# Patient Record
Sex: Female | Born: 1935 | Race: White | Hispanic: No | Smoking: Never smoker
Health system: Southern US, Community
[De-identification: ages and names within clinical notes are randomized; demographics above are authoritative.]

## PROBLEM LIST (undated history)

## (undated) DIAGNOSIS — E7800 Pure hypercholesterolemia, unspecified: Secondary | ICD-10-CM

## (undated) DIAGNOSIS — E119 Type 2 diabetes mellitus without complications: Secondary | ICD-10-CM

## (undated) DIAGNOSIS — F0390 Unspecified dementia without behavioral disturbance: Secondary | ICD-10-CM

## (undated) DIAGNOSIS — I509 Heart failure, unspecified: Secondary | ICD-10-CM

## (undated) DIAGNOSIS — I10 Essential (primary) hypertension: Secondary | ICD-10-CM

## (undated) HISTORY — DX: Heart failure, unspecified: I50.9

## (undated) HISTORY — PX: ABDOMINAL HYSTERECTOMY: SHX81

## (undated) HISTORY — PX: HAND SURGERY: SHX662

---

## 1998-11-03 ENCOUNTER — Encounter: Payer: Self-pay | Admitting: Emergency Medicine

## 1998-11-03 ENCOUNTER — Emergency Department (HOSPITAL_COMMUNITY): Admission: EM | Admit: 1998-11-03 | Discharge: 1998-11-03 | Payer: Self-pay | Admitting: Emergency Medicine

## 1998-12-09 ENCOUNTER — Ambulatory Visit (HOSPITAL_COMMUNITY): Admission: RE | Admit: 1998-12-09 | Discharge: 1998-12-09 | Payer: Self-pay | Admitting: Family Medicine

## 1998-12-09 ENCOUNTER — Encounter: Payer: Self-pay | Admitting: Family Medicine

## 1999-02-27 ENCOUNTER — Encounter: Payer: Self-pay | Admitting: Emergency Medicine

## 1999-02-27 ENCOUNTER — Emergency Department (HOSPITAL_COMMUNITY): Admission: EM | Admit: 1999-02-27 | Discharge: 1999-02-27 | Payer: Self-pay | Admitting: Emergency Medicine

## 1999-09-23 ENCOUNTER — Encounter: Payer: Self-pay | Admitting: Family Medicine

## 1999-09-23 ENCOUNTER — Ambulatory Visit (HOSPITAL_COMMUNITY): Admission: RE | Admit: 1999-09-23 | Discharge: 1999-09-23 | Payer: Self-pay | Admitting: Family Medicine

## 2000-06-25 ENCOUNTER — Encounter: Payer: Self-pay | Admitting: Emergency Medicine

## 2000-06-25 ENCOUNTER — Emergency Department (HOSPITAL_COMMUNITY): Admission: EM | Admit: 2000-06-25 | Discharge: 2000-06-25 | Payer: Self-pay | Admitting: Emergency Medicine

## 2001-02-07 ENCOUNTER — Emergency Department (HOSPITAL_COMMUNITY): Admission: EM | Admit: 2001-02-07 | Discharge: 2001-02-07 | Payer: Self-pay | Admitting: Emergency Medicine

## 2001-02-07 ENCOUNTER — Encounter: Payer: Self-pay | Admitting: Emergency Medicine

## 2001-05-24 ENCOUNTER — Emergency Department (HOSPITAL_COMMUNITY): Admission: EM | Admit: 2001-05-24 | Discharge: 2001-05-24 | Payer: Self-pay | Admitting: Emergency Medicine

## 2001-05-24 ENCOUNTER — Encounter: Payer: Self-pay | Admitting: Emergency Medicine

## 2001-06-30 ENCOUNTER — Emergency Department (HOSPITAL_COMMUNITY): Admission: EM | Admit: 2001-06-30 | Discharge: 2001-06-30 | Payer: Self-pay | Admitting: Emergency Medicine

## 2002-02-10 ENCOUNTER — Encounter: Payer: Self-pay | Admitting: Emergency Medicine

## 2002-02-10 ENCOUNTER — Emergency Department (HOSPITAL_COMMUNITY): Admission: EM | Admit: 2002-02-10 | Discharge: 2002-02-10 | Payer: Self-pay | Admitting: Emergency Medicine

## 2002-04-03 ENCOUNTER — Encounter: Payer: Self-pay | Admitting: Otolaryngology

## 2002-04-03 ENCOUNTER — Encounter: Admission: RE | Admit: 2002-04-03 | Discharge: 2002-04-03 | Payer: Self-pay | Admitting: Otolaryngology

## 2002-04-05 ENCOUNTER — Encounter (INDEPENDENT_AMBULATORY_CARE_PROVIDER_SITE_OTHER): Payer: Self-pay | Admitting: *Deleted

## 2002-04-05 ENCOUNTER — Ambulatory Visit (HOSPITAL_BASED_OUTPATIENT_CLINIC_OR_DEPARTMENT_OTHER): Admission: RE | Admit: 2002-04-05 | Discharge: 2002-04-05 | Payer: Self-pay | Admitting: Otolaryngology

## 2002-09-29 ENCOUNTER — Emergency Department (HOSPITAL_COMMUNITY): Admission: EM | Admit: 2002-09-29 | Discharge: 2002-09-29 | Payer: Self-pay | Admitting: Emergency Medicine

## 2002-09-29 ENCOUNTER — Encounter: Payer: Self-pay | Admitting: Emergency Medicine

## 2003-09-10 ENCOUNTER — Encounter: Admission: RE | Admit: 2003-09-10 | Discharge: 2003-09-10 | Payer: Self-pay | Admitting: Family Medicine

## 2004-07-02 ENCOUNTER — Emergency Department (HOSPITAL_COMMUNITY): Admission: EM | Admit: 2004-07-02 | Discharge: 2004-07-02 | Payer: Self-pay | Admitting: Family Medicine

## 2004-09-11 ENCOUNTER — Emergency Department (HOSPITAL_COMMUNITY): Admission: EM | Admit: 2004-09-11 | Discharge: 2004-09-11 | Payer: Self-pay | Admitting: Emergency Medicine

## 2004-09-15 ENCOUNTER — Emergency Department (HOSPITAL_COMMUNITY): Admission: EM | Admit: 2004-09-15 | Discharge: 2004-09-15 | Payer: Self-pay | Admitting: Family Medicine

## 2004-11-30 ENCOUNTER — Emergency Department (HOSPITAL_COMMUNITY): Admission: EM | Admit: 2004-11-30 | Discharge: 2004-11-30 | Payer: Self-pay | Admitting: Emergency Medicine

## 2005-01-27 ENCOUNTER — Encounter: Admission: RE | Admit: 2005-01-27 | Discharge: 2005-01-27 | Payer: Self-pay | Admitting: Family Medicine

## 2005-02-14 ENCOUNTER — Encounter: Admission: RE | Admit: 2005-02-14 | Discharge: 2005-02-14 | Payer: Self-pay | Admitting: Family Medicine

## 2005-02-20 ENCOUNTER — Emergency Department (HOSPITAL_COMMUNITY): Admission: EM | Admit: 2005-02-20 | Discharge: 2005-02-21 | Payer: Self-pay | Admitting: Emergency Medicine

## 2005-04-27 ENCOUNTER — Encounter: Admission: RE | Admit: 2005-04-27 | Discharge: 2005-04-27 | Payer: Self-pay | Admitting: Family Medicine

## 2005-06-17 ENCOUNTER — Encounter (INDEPENDENT_AMBULATORY_CARE_PROVIDER_SITE_OTHER): Payer: Self-pay | Admitting: Cardiology

## 2005-06-17 ENCOUNTER — Ambulatory Visit (HOSPITAL_COMMUNITY): Admission: RE | Admit: 2005-06-17 | Discharge: 2005-06-18 | Payer: Self-pay | Admitting: Cardiology

## 2005-07-02 ENCOUNTER — Emergency Department (HOSPITAL_COMMUNITY): Admission: EM | Admit: 2005-07-02 | Discharge: 2005-07-02 | Payer: Self-pay | Admitting: Emergency Medicine

## 2005-08-01 ENCOUNTER — Emergency Department (HOSPITAL_COMMUNITY): Admission: EM | Admit: 2005-08-01 | Discharge: 2005-08-01 | Payer: Self-pay | Admitting: *Deleted

## 2005-08-29 ENCOUNTER — Emergency Department (HOSPITAL_COMMUNITY): Admission: EM | Admit: 2005-08-29 | Discharge: 2005-08-29 | Payer: Self-pay | Admitting: Emergency Medicine

## 2005-12-14 ENCOUNTER — Emergency Department (HOSPITAL_COMMUNITY): Admission: EM | Admit: 2005-12-14 | Discharge: 2005-12-14 | Payer: Self-pay | Admitting: Emergency Medicine

## 2006-03-30 ENCOUNTER — Encounter: Admission: RE | Admit: 2006-03-30 | Discharge: 2006-03-30 | Payer: Self-pay | Admitting: Pediatrics

## 2007-01-02 ENCOUNTER — Encounter: Admission: RE | Admit: 2007-01-02 | Discharge: 2007-01-02 | Payer: Self-pay | Admitting: Orthopedic Surgery

## 2007-01-03 ENCOUNTER — Ambulatory Visit (HOSPITAL_BASED_OUTPATIENT_CLINIC_OR_DEPARTMENT_OTHER): Admission: RE | Admit: 2007-01-03 | Discharge: 2007-01-04 | Payer: Self-pay | Admitting: Orthopedic Surgery

## 2008-11-05 ENCOUNTER — Encounter: Admission: RE | Admit: 2008-11-05 | Discharge: 2008-11-05 | Payer: Self-pay | Admitting: Geriatric Medicine

## 2010-06-13 ENCOUNTER — Encounter: Payer: Self-pay | Admitting: Family Medicine

## 2010-10-05 NOTE — Op Note (Signed)
NAMECLORINDA, WYBLE                  ACCOUNT NO.:  [id number]   MEDICAL RECORD NO.:  [id number]          PATIENT TYPE:  AMB   LOCATION:  DSC                          FACILITY:  [facility]   PHYSICIAN:  Matthew A. Weingold, M.D.DATE OF BIRTH:  1929/12/31   DATE OF PROCEDURE:  01/03/2007  DATE OF DISCHARGE:                               OPERATIVE REPORT   PREOPERATIVE DIAGNOSIS:  Left thumb carpometacarpal arthritis, left  thumb metacarpophalangeal arthritis.   POSTOPERATIVE DIAGNOSIS:  Left thumb carpometacarpal arthritis, left  thumb metacarpophalangeal arthritis.   PROCEDURE:  1. Left thumb carpometacarpal joint suspension plasty with abductor      pollicis longus tendon transfer.  2. Left thumb MP fusion with 28 mm mini Acutrak screw, local bone      graft.   SURGEON:  Artist Pais. Mina Marble, M.D.   ASSISTANT:  None.   ANESTHESIA:  General.   TOURNIQUET TIME:  90 minutes.   COMPLICATIONS:  None.   DRAINS:  None.   OPERATIVE REPORT:  The patient was taken to operating suite and after  induction of adequate general anesthesia, left upper extremity was  prepped in sterile fashion.  Esmarch was used to exsanguinate the limb.  Tourniquet inflated to 250 mm.  At this point in time incision was made  in the [facility] joint longitudinally.  Skin was incised.  Snuffbox artery was  identified and retracted.  The [facility] joint was entered dorsally and  elevated off the base of the thumb metacarpal and trapezium.  The  trapezium was removed in piecemeal using curettes, osteotomes and  rongeurs.  Once this was done, the [facility] synovectomy was performed next  transosseous canal was created thumb metacarpal base using a hand awl  and sequential hand drillings.  Once this was completed the APL tendon  was identified in the base of the original wound.  The radial most slip  was separated was then traced under the skin.  The first dorsal  compartment released under the skin bridge and a second incision was  made at the musculotendinous junction.  The APL musculotendinous  junction was incised and the radial slip was then drawn to the distal  wound.  It was then passed from dorsal to volar out the thumb metacarpal  wrapped around the flexor carpi radialis at the base of the wound,  sutured with 2-0 FiberWire and back up on top of the thumb metacarpal  base sutured to itself again with 2-0 FiberWire thus creating the  suspension.  After this was done, the wounds irrigated.  A third  incision was made over the MP joint of the thumb.  Dissection was  carried down through the extensor sheath which was incised and retracted  ulnarly.  The joint was incised longitudinally.  The capsule was  elevated off the joint.  Decortication was then taken out until  cancellus bone was achieved.  Thumb MP joint was then placed in 35  degrees of flexion and slight pronation followed by placement of guide  wire across the fusion site to the mini Acutrak screw.  A 28 mm Acutrak  screw was then placed in that area.  Local bone graft was harvested from  the surrounding area and packed into the fusion site.  Once this was  done, the wounds were all irrigated loosely closed in layers of 4-0  Vicryl for the  [facility] joint capsule, 4-0 Vicryl for the extensor mechanism over the MP  joint and 4-0 Vicryl Rapide for all three skin incisions.  Xeroform,  4x4s and radial gutter splint was applied.  The patient tolerated  procedures well and went recovery room stable fashion.      Artist Pais Mina Marble, M.D.  Electronically Signed     MAW/MEDQ  D:  01/03/2007  T:  01/04/2007  Job:  [id number]

## 2010-10-08 NOTE — Op Note (Signed)
   NAME:  Debbie Chambers, Debbie Chambers                            ACCOUNT NO.:  [id number]   MEDICAL RECORD NO.:  [id number]                   PATIENT TYPE:  AMB   LOCATION:  DSC                                  FACILITY:  [facility]   PHYSICIAN:  Christopher E. Ezzard Standing, M.D.         DATE OF BIRTH:  07/16/1929   DATE OF PROCEDURE:  04/05/2002  DATE OF DISCHARGE:                                 OPERATIVE REPORT   PREOPERATIVE DIAGNOSIS:  Left neck nodule.   POSTOPERATIVE DIAGNOSIS:  Left neck nodule, (findings consistent with  probable lipoma).   OPERATION:  Excision of left neck mass 1.5 cm size.   SURGEON:  Kristine Garbe. Ezzard Standing, M.D.   ANESTHESIA:  Via MAC.   COMPLICATIONS:  None.   INDICATIONS FOR PROCEDURE:  The patient is a 77 year old female who has had  an approximately 1.5 cm left neck nodule along the posterior aspect of the  left sternocleidomastoid muscle for over two months now.  She is taken to  the operating room at this time for an excision of the left neck nodule.   DESCRIPTION OF PROCEDURE:  After adequate IV sedation, the patient's left  neck was prepped and draped with Betadine solution.  The area of the nodule  was marked out and injected with 3 cc of Xylocaine with epinephrine.  An  incision was made directly over the nodule.  The dissection was carried down  through the subcutaneous tissue.  The nodule was consistent with probable  lipoma, as it was superficial to the sternocleidomastoid muscle and was  adjacent to the great auricular nerve.  The nodule was excised and sent to  pathology.  Hemostasis was obtained with cautery, and the wound was closed  with #3-0 chromic sutures subcutaneously and #5-0 nylon on the skin.  A  sterile dressing was applied.  The patient was subsequently transferred to the recovery room  postoperatively doing well.   DISPOSITION:  The patient is discharged home later this morning on Tylenol  and Darvocet for pain.  Followup in my office in one  week, to have the  sutures removed and to review the pathology.                                                 Kristine Garbe. Ezzard Standing, M.D.    CEN/MEDQ  D:  04/05/2002  T:  04/05/2002  Job:  [id number]   cc:   Loura Back, M.D.

## 2010-10-08 NOTE — H&P (Signed)
[name], Chambers NO.:  [id number]   MEDICAL RECORD NO.:  [id number]          PATIENT TYPE:  OIB   LOCATION:  2628                         FACILITY:  [facility]   PHYSICIAN:  Francisca December, M.D.  DATE OF BIRTH:  [birth year]   DATE OF ADMISSION:  06/17/2005  DATE OF DISCHARGE:                                HISTORY & PHYSICAL   DISCHARGE DIAGNOSES:  1.  Atrial fibrillation, resolved.  2.  Hyperlipidemia, treated.  3.  Diabetes mellitus, treated.  4.  Hypertension, treated.  5.  Right supraspinatus tear, for shoulder surgery soon.  6.  Proteinuria.   Debbie Chambers is a 77 year old female who was in the preoperative area today at  [facility] in order to undergo shoulder surgery by Dr. Doneen Poisson. She developed rapid SVT with a ventricular rate at 170. Her  surgery was cancelled and she was brought to the holding area for cardiology  evaluation. Her EKG showed normal sinus rhythm with occasional short bursts  of SVT/atrial fibrillation. She is currently in normal sinus rhythm with a  rate in the 90s.   She was seen in the office in October 2006 by Dr. Corliss Marcus for  preoperative clearance. At that time she underwent an adenosine Cardiolite.  Her ejection fraction was 75% with no ischemia and no wall motion  abnormalities.   ALLERGIES:  PENICILLIN causes a rash.   MEDICATIONS:  1.  Lipitor 40 mg a day.  2.  Glyburide 2.5/500 mg b.i.d.  3.  Norvasc 10 mg a day.  4.  Hydrochlorothiazide 12.5 mg daily.  5.  Enteric-coated aspirin 325 mg a day.   SOCIAL HISTORY:  She is a homemaker, she is widowed. She had an aide who  helps her at home. No tobacco, alcohol, or illicit drug use.   FAMILY HISTORY:  Father deceased at age 80 of myocardial infarction.   PAST MEDICAL HISTORY:  1.  Diabetes mellitus, oral agents.  2.  Hypertension.  3.  Hyperlipidemia.  4.  Proteinuria.  5.  She has undergone a hysterectomy, cholecystectomy, and cataract  surgery.   REVIEW OF SYSTEMS:  Otherwise as above.   PHYSICAL EXAMINATION:  VITAL SIGNS:  Pulse 101, respirations 16, blood  pressure is 140/70 at bedside, O2 saturation is 94% on room air.  HEENT:  Grossly normal. No carotid bruits, no subclavian bruits, no JVD, no  thyromegaly. Sclerae clear, conjunctivae normal. Nares without drainage.  CHEST:  Clear to auscultation bilaterally. No wheeze or rhonchi.  HEART:  Regular rate and rhythm with a 1/6 systolic murmur at the left  sternal border and no diastolic component.  ABDOMEN:  Soft, nontender, nondistended, no masses, no bruits.  EXTREMITIES:  No peripheral edema.  SKIN:  Warm and dry.  NEUROLOGIC:  Alert and oriented x3.   LABORATORY STUDIES:  Show sodium 140, potassium 4.2, BUN 18, creatinine 0.9.  White count 12.4, hemoglobin 12.9, hematocrit 38.4, platelets 350. EKG shows  normal sinus rhythm with a ventricular response at 109 beats per minute. She  does have frequent PACs and occasional  short bursts of atrial fibrillation.   ASSESSMENT AND PLAN:  1.  Paroxysmal atrial fibrillation.  2.  Hypertension.  3.  Hyperlipidemia.  4.  Diabetes mellitus.  5.  Proteinuria.  6.  Right supraspinatus tear; shoulder surgery on hold.   The patient has been seen and examined by Dr. Amil Amen. We will go ahead and  obtain an emergent 2-D echocardiogram. The surgery has been on hold for now.  Hopefully this can be resumed next week. We will go ahead and add Lopressor.  We will go ahead and give her IV Lopressor and then start oral Lopressor 25  mg p.o. b.i.d. We will watch her in the hospital overnight and if she  remains stable, plan for discharge tomorrow.      Debbie Chambers, P.A.      Francisca December, M.D.  Electronically Signed    LB/MEDQ  D:  06/17/2005  T:  06/18/2005  Job:  [id number]   cc:   Vanita Panda. Magnus Ivan, M.D.  Fax: [phone]

## 2011-03-07 LAB — BASIC METABOLIC PANEL
BUN: 14
CO2: 28
Calcium: 9.3
Chloride: 101
Creatinine, Ser: 0.85
GFR calc Af Amer: >60
GFR calc non Af Amer: >60
Glucose, Bld: 140 — ABNORMAL HIGH
Potassium: 4.3
Sodium: 138

## 2011-03-07 LAB — POCT HEMOGLOBIN-HEMACUE
Hemoglobin: 12.4
Operator id: 123881

## 2012-11-12 ENCOUNTER — Encounter (HOSPITAL_COMMUNITY): Payer: Self-pay | Admitting: Emergency Medicine

## 2012-11-12 DIAGNOSIS — Z79899 Other long term (current) drug therapy: Secondary | ICD-10-CM

## 2012-11-12 DIAGNOSIS — Z794 Long term (current) use of insulin: Secondary | ICD-10-CM

## 2012-11-12 DIAGNOSIS — E119 Type 2 diabetes mellitus without complications: Secondary | ICD-10-CM | POA: Diagnosis present

## 2012-11-12 DIAGNOSIS — E785 Hyperlipidemia, unspecified: Secondary | ICD-10-CM | POA: Diagnosis present

## 2012-11-12 DIAGNOSIS — N189 Chronic kidney disease, unspecified: Secondary | ICD-10-CM | POA: Diagnosis present

## 2012-11-12 DIAGNOSIS — Z7982 Long term (current) use of aspirin: Secondary | ICD-10-CM

## 2012-11-12 DIAGNOSIS — E7800 Pure hypercholesterolemia, unspecified: Secondary | ICD-10-CM | POA: Diagnosis present

## 2012-11-12 DIAGNOSIS — I129 Hypertensive chronic kidney disease with stage 1 through stage 4 chronic kidney disease, or unspecified chronic kidney disease: Secondary | ICD-10-CM | POA: Diagnosis present

## 2012-11-12 DIAGNOSIS — I509 Heart failure, unspecified: Secondary | ICD-10-CM | POA: Diagnosis present

## 2012-11-12 DIAGNOSIS — N184 Chronic kidney disease, stage 4 (severe): Secondary | ICD-10-CM | POA: Diagnosis present

## 2012-11-12 DIAGNOSIS — F0390 Unspecified dementia without behavioral disturbance: Secondary | ICD-10-CM | POA: Diagnosis present

## 2012-11-12 DIAGNOSIS — Z8249 Family history of ischemic heart disease and other diseases of the circulatory system: Secondary | ICD-10-CM

## 2012-11-12 DIAGNOSIS — Z880 Allergy status to penicillin: Secondary | ICD-10-CM

## 2012-11-12 DIAGNOSIS — I5031 Acute diastolic (congestive) heart failure: Principal | ICD-10-CM | POA: Diagnosis present

## 2012-11-12 DIAGNOSIS — D509 Iron deficiency anemia, unspecified: Secondary | ICD-10-CM | POA: Diagnosis present

## 2012-11-12 DIAGNOSIS — D649 Anemia, unspecified: Secondary | ICD-10-CM | POA: Diagnosis present

## 2012-11-12 DIAGNOSIS — D72829 Elevated white blood cell count, unspecified: Secondary | ICD-10-CM | POA: Diagnosis present

## 2012-11-12 LAB — CBC WITH DIFFERENTIAL/PLATELET
Basophils Absolute: 0 10*3/uL (ref 0.0–0.1)
Basophils Relative: 0 % (ref 0–1)
Eosinophils Relative: 1 % (ref 0–5)
HCT: 38.7 % (ref 36.0–46.0)
Hemoglobin: 12.7 g/dL (ref 12.0–15.0)
Lymphocytes Relative: 13 % (ref 12–46)
MCH: 25.9 pg — ABNORMAL LOW (ref 26.0–34.0)
MCHC: 32.8 g/dL (ref 30.0–36.0)
MCV: 79 fL (ref 78.0–100.0)
Monocytes Absolute: 1.1 10*3/uL — ABNORMAL HIGH (ref 0.1–1.0)
Neutrophils Relative %: 77 % (ref 43–77)
Platelets: 351 10*3/uL (ref 150–400)
RBC: 4.9 MIL/uL (ref 3.87–5.11)
RDW: 14.8 % (ref 11.5–15.5)
WBC: 12 10*3/uL — ABNORMAL HIGH (ref 4.0–10.5)

## 2012-11-12 LAB — COMPREHENSIVE METABOLIC PANEL
ALT: 13 U/L (ref 0–35)
AST: 30 U/L (ref 0–37)
Albumin: 3.4 g/dL — ABNORMAL LOW (ref 3.5–5.2)
Alkaline Phosphatase: 26 U/L — ABNORMAL LOW (ref 39–117)
BUN: 46 mg/dL — ABNORMAL HIGH (ref 6–23)
CO2: 29 mEq/L (ref 19–32)
Calcium: 9.1 mg/dL (ref 8.4–10.5)
Chloride: 99 mEq/L (ref 96–112)
Creatinine, Ser: 1.72 mg/dL — ABNORMAL HIGH (ref 0.50–1.10)
GFR calc non Af Amer: 27 mL/min — ABNORMAL LOW (ref >90)
Potassium: 3.9 mEq/L (ref 3.5–5.1)
Sodium: 140 mEq/L (ref 135–145)
Total Bilirubin: 0.2 mg/dL — ABNORMAL LOW (ref 0.3–1.2)
Total Protein: 7.5 g/dL (ref 6.0–8.3)

## 2012-11-12 NOTE — ED Notes (Signed)
FAMILY REPORTS PROGRESSING LOWER LEGS/ANKLES/FEET EDEMA/SWELLING ONSET THIS WEEKEND , DENIES INJURY.

## 2012-11-13 ENCOUNTER — Inpatient Hospital Stay (HOSPITAL_COMMUNITY)
Admit: 2012-11-13 | Discharge: 2012-11-16 | DRG: 293 | Disposition: A | Discharge status: Home / Self Care | Payer: Medicare Other | Attending: Internal Medicine | Admitting: Internal Medicine

## 2012-11-13 ENCOUNTER — Encounter (HOSPITAL_COMMUNITY): Payer: Self-pay

## 2012-11-13 ENCOUNTER — Emergency Department (HOSPITAL_COMMUNITY): Payer: Medicare Other

## 2012-11-13 DIAGNOSIS — E119 Type 2 diabetes mellitus without complications: Secondary | ICD-10-CM

## 2012-11-13 DIAGNOSIS — F0390 Unspecified dementia without behavioral disturbance: Secondary | ICD-10-CM | POA: Diagnosis present

## 2012-11-13 DIAGNOSIS — E785 Hyperlipidemia, unspecified: Secondary | ICD-10-CM | POA: Diagnosis present

## 2012-11-13 DIAGNOSIS — I10 Essential (primary) hypertension: Secondary | ICD-10-CM

## 2012-11-13 DIAGNOSIS — I509 Heart failure, unspecified: Secondary | ICD-10-CM

## 2012-11-13 DIAGNOSIS — E1129 Type 2 diabetes mellitus with other diabetic kidney complication: Secondary | ICD-10-CM | POA: Diagnosis present

## 2012-11-13 DIAGNOSIS — I5031 Acute diastolic (congestive) heart failure: Secondary | ICD-10-CM

## 2012-11-13 HISTORY — DX: Essential (primary) hypertension: I10

## 2012-11-13 HISTORY — DX: Pure hypercholesterolemia, unspecified: E78.00

## 2012-11-13 HISTORY — DX: Type 2 diabetes mellitus without complications: E11.9

## 2012-11-13 HISTORY — DX: Unspecified dementia, unspecified severity, without behavioral disturbance, psychotic disturbance, mood disturbance, and anxiety: F03.90

## 2012-11-13 LAB — GLUCOSE, CAPILLARY
Glucose-Capillary: 128 mg/dL — ABNORMAL HIGH (ref 70–99)
Glucose-Capillary: 210 mg/dL — ABNORMAL HIGH (ref 70–99)

## 2012-11-13 LAB — CBC WITH DIFFERENTIAL/PLATELET
Basophils Absolute: 0 10*3/uL (ref 0.0–0.1)
Basophils Relative: 0 % (ref 0–1)
Eosinophils Absolute: 0.1 10*3/uL (ref 0.0–0.7)
Eosinophils Relative: 1 % (ref 0–5)
HCT: 39 % (ref 36.0–46.0)
MCH: 25.1 pg — ABNORMAL LOW (ref 26.0–34.0)
MCHC: 32.1 g/dL (ref 30.0–36.0)
MCV: 78.3 fL (ref 78.0–100.0)
Monocytes Absolute: 1 10*3/uL (ref 0.1–1.0)
Neutro Abs: 6.6 10*3/uL (ref 1.7–7.7)
RDW: 14.7 % (ref 11.5–15.5)

## 2012-11-13 LAB — URINALYSIS, ROUTINE W REFLEX MICROSCOPIC
Nitrite: NEGATIVE
Protein, ur: NEGATIVE mg/dL
Specific Gravity, Urine: 1.013 (ref 1.005–1.030)
Urobilinogen, UA: 0.2 mg/dL (ref 0.0–1.0)

## 2012-11-13 LAB — BASIC METABOLIC PANEL
BUN: 43 mg/dL — ABNORMAL HIGH (ref 6–23)
CO2: 27 mEq/L (ref 19–32)
Calcium: 9.2 mg/dL (ref 8.4–10.5)
Creatinine, Ser: 1.73 mg/dL — ABNORMAL HIGH (ref 0.50–1.10)

## 2012-11-13 LAB — PRO B NATRIURETIC PEPTIDE: Pro B Natriuretic peptide (BNP): 1362 pg/mL — ABNORMAL HIGH (ref 0–450)

## 2012-11-13 LAB — TSH: TSH: 1.51 u[IU]/mL (ref 0.350–4.500)

## 2012-11-13 LAB — OSMOLALITY, URINE: Osmolality, Ur: 377 mOsm/kg — ABNORMAL LOW (ref 390–1090)

## 2012-11-13 LAB — TROPONIN I: Troponin I: <0.3 ng/mL (ref <0.30)

## 2012-11-13 LAB — SODIUM, URINE, RANDOM: Sodium, Ur: 95 mEq/L

## 2012-11-13 MED ORDER — FUROSEMIDE 10 MG/ML IJ SOLN
40.0000 mg | Freq: Every day | INTRAMUSCULAR | Status: DC
  Administered 2012-11-13 – 2012-11-15 (×3): 40 mg via INTRAVENOUS
  Filled 2012-11-13 (×3): qty 4

## 2012-11-13 MED ORDER — METOPROLOL TARTRATE 25 MG PO TABS
25.0000 mg | ORAL_TABLET | Freq: Every day | ORAL | Status: DC
  Administered 2012-11-13 – 2012-11-14 (×2): 25 mg via ORAL
  Filled 2012-11-13 (×2): qty 1

## 2012-11-13 MED ORDER — AMLODIPINE BESYLATE 10 MG PO TABS
10.0000 mg | ORAL_TABLET | Freq: Every day | ORAL | Status: DC
  Administered 2012-11-13 – 2012-11-16 (×4): 10 mg via ORAL
  Filled 2012-11-13 (×4): qty 1

## 2012-11-13 MED ORDER — ADULT MULTIVITAMIN W/MINERALS CH
1.0000 | ORAL_TABLET | Freq: Every day | ORAL | Status: DC
  Administered 2012-11-13 – 2012-11-16 (×4): 1 via ORAL
  Filled 2012-11-13 (×4): qty 1

## 2012-11-13 MED ORDER — FENOFIBRATE 160 MG PO TABS
160.0000 mg | ORAL_TABLET | Freq: Every day | ORAL | Status: DC
  Administered 2012-11-13 – 2012-11-16 (×4): 160 mg via ORAL
  Filled 2012-11-13 (×4): qty 1

## 2012-11-13 MED ORDER — ENOXAPARIN SODIUM 40 MG/0.4ML ~~LOC~~ SOLN: 40.0000 mg | SUBCUTANEOUS | Status: DC

## 2012-11-13 MED ORDER — CLONIDINE HCL 0.2 MG PO TABS
0.2000 mg | ORAL_TABLET | Freq: Two times a day (BID) | ORAL | Status: DC
  Administered 2012-11-13 – 2012-11-14 (×3): 0.2 mg via ORAL
  Filled 2012-11-13 (×4): qty 1

## 2012-11-13 MED ORDER — ONDANSETRON HCL 4 MG/2ML IJ SOLN: 4.0000 mg | Freq: Four times a day (QID) | INTRAMUSCULAR | Status: DC | PRN

## 2012-11-13 MED ORDER — HYDRALAZINE HCL 25 MG PO TABS
25.0000 mg | ORAL_TABLET | Freq: Three times a day (TID) | ORAL | Status: DC
  Administered 2012-11-13 – 2012-11-16 (×10): 25 mg via ORAL
  Filled 2012-11-13 (×13): qty 1

## 2012-11-13 MED ORDER — ACETAMINOPHEN 325 MG PO TABS: 650.0000 mg | ORAL_TABLET | Freq: Four times a day (QID) | ORAL | Status: DC | PRN

## 2012-11-13 MED ORDER — ASPIRIN EC 325 MG PO TBEC
325.0000 mg | DELAYED_RELEASE_TABLET | Freq: Every day | ORAL | Status: DC
  Administered 2012-11-13 – 2012-11-16 (×4): 325 mg via ORAL
  Filled 2012-11-13 (×4): qty 1

## 2012-11-13 MED ORDER — FUROSEMIDE 10 MG/ML IJ SOLN
20.0000 mg | Freq: Once | INTRAMUSCULAR | Status: DC
  Administered 2012-11-13: 20 mg via INTRAVENOUS
  Filled 2012-11-13: qty 2

## 2012-11-13 MED ORDER — ONDANSETRON HCL 4 MG PO TABS: 4.0000 mg | ORAL_TABLET | Freq: Four times a day (QID) | ORAL | Status: DC | PRN

## 2012-11-13 MED ORDER — DONEPEZIL HCL 10 MG PO TABS
10.0000 mg | ORAL_TABLET | Freq: Every day | ORAL | Status: DC
  Administered 2012-11-13 – 2012-11-15 (×3): 10 mg via ORAL
  Filled 2012-11-13 (×4): qty 1

## 2012-11-13 MED ORDER — OMEGA-3-ACID ETHYL ESTERS 1 G PO CAPS
1.0000 g | ORAL_CAPSULE | Freq: Two times a day (BID) | ORAL | Status: DC
  Administered 2012-11-13 – 2012-11-16 (×7): 1 g via ORAL
  Filled 2012-11-13 (×8): qty 1

## 2012-11-13 MED ORDER — INSULIN ASPART 100 UNIT/ML ~~LOC~~ SOLN: 60.0000 [IU] | Freq: Two times a day (BID) | SUBCUTANEOUS | Status: DC

## 2012-11-13 MED ORDER — ACETAMINOPHEN 650 MG RE SUPP: 650.0000 mg | Freq: Four times a day (QID) | RECTAL | Status: DC | PRN

## 2012-11-13 MED ORDER — SIMVASTATIN 40 MG PO TABS: 40.0000 mg | ORAL_TABLET | Freq: Every day | ORAL | Status: DC

## 2012-11-13 MED ORDER — LISINOPRIL 40 MG PO TABS: 40.0000 mg | ORAL_TABLET | Freq: Every day | ORAL | Status: DC

## 2012-11-13 MED ORDER — ATORVASTATIN CALCIUM 20 MG PO TABS
20.0000 mg | ORAL_TABLET | Freq: Every day | ORAL | Status: DC
  Administered 2012-11-13 – 2012-11-16 (×4): 20 mg via ORAL
  Filled 2012-11-13 (×4): qty 1

## 2012-11-13 MED ORDER — FERROUS SULFATE 325 (65 FE) MG PO TABS
325.0000 mg | ORAL_TABLET | Freq: Every day | ORAL | Status: DC
  Administered 2012-11-14 – 2012-11-16 (×3): 325 mg via ORAL
  Filled 2012-11-13 (×4): qty 1

## 2012-11-13 MED ORDER — SODIUM CHLORIDE 0.9 % IJ SOLN
3.0000 mL | Freq: Two times a day (BID) | INTRAMUSCULAR | Status: DC
  Administered 2012-11-13 – 2012-11-16 (×7): 3 mL via INTRAVENOUS

## 2012-11-13 MED ORDER — INSULIN ASPART 100 UNIT/ML ~~LOC~~ SOLN
0.0000 [IU] | Freq: Three times a day (TID) | SUBCUTANEOUS | Status: DC
Start: 2012-11-13 — End: 2012-11-15
  Administered 2012-11-13 – 2012-11-14 (×2): 1 [IU] via SUBCUTANEOUS
  Administered 2012-11-14 – 2012-11-15 (×3): 2 [IU] via SUBCUTANEOUS

## 2012-11-13 NOTE — H&P (Signed)
[facility] History and Physical  Debbie Chambers JXB:[id number] DOB: 1930/02/04 DOA: 11/13/2012  Referring physician: ER physician. PCP: Provider Not In System   History obtained from patient's daughter and ER physician as patient has dementia.  Chief Complaint: Lower extremity edema.  HPI: Debbie Chambers is a 79 y.o. female with known history of dementia, diabetes mellitus type 2, hypertension and hyperlipidemia was noticed to have increased lower extremity edema over the last one week the patient's daughter. Patient also was noticed to have shortness of breath on minimal exertion. Due to the concerns of her recent changes patient was brought to the ER. Chest x-ray was showing congestion and BNP was elevated. Patient has been admitted for CHF decompensation. As per patient's daughter she was not diagnosed with CHF previously. Patient does not complain of any chest pain does not have any nausea vomiting abdominal pain diarrhea or any fever chills. Patient is demented and does not contribute much to the history. She usually walks with a walker but over the last one week she was not able to that.  Review of Systems: As presented in the history of presenting illness, rest negative.  Past Medical History  Diagnosis Date  . Dementia   . Hypertension   . Diabetes mellitus without complication   . Hypercholesterolemia    Past Surgical History  Procedure Laterality Date  . Hand surgery    . Abdominal hysterectomy     Social History:  reports that she has never smoked. She has never used smokeless tobacco. She reports that she does not drink alcohol or use illicit drugs. Lives at home. As per daughter patient has Home health aide. where does patient live-- Not sure. Can patient participate in ADLs?  Allergies  Allergen Reactions  . Penicillins     Family History  Problem Relation Age of Onset  . CAD Father       Prior to Admission medications   Medication Sig Start Date End Date  Taking? Authorizing Provider  amLODipine (NORVASC) 10 MG tablet Take 10 mg by mouth daily.   Yes Historical Provider, MD  aspirin EC 81 MG tablet Take 81 mg by mouth daily.   Yes Historical Provider, MD  Cholecalciferol (VITAMIN D PO) Take 1 tablet by mouth daily.   Yes Historical Provider, MD  cloNIDine (CATAPRES) 0.2 MG tablet Take 0.2 mg by mouth 2 (two) times daily.   Yes Historical Provider, MD  donepezil (ARICEPT) 10 MG tablet Take 10 mg by mouth at bedtime.   Yes Historical Provider, MD  fenofibrate 160 MG tablet Take 160 mg by mouth daily.   Yes Historical Provider, MD  ferrous sulfate 325 (65 FE) MG tablet Take 325 mg by mouth daily with breakfast.   Yes Historical Provider, MD  hydrochlorothiazide (HYDRODIURIL) 25 MG tablet Take 12.5 mg by mouth daily.   Yes Historical Provider, MD  insulin aspart (NOVOLOG) 100 UNIT/ML injection Inject 60 Units into the skin 2 (two) times daily before a meal.   Yes Historical Provider, MD  lisinopril (PRINIVIL,ZESTRIL) 40 MG tablet Take 40 mg by mouth daily.   Yes Historical Provider, MD  metoprolol (LOPRESSOR) 50 MG tablet Take 25 mg by mouth daily.   Yes Historical Provider, MD  Multiple Vitamin (MULTIVITAMIN WITH MINERALS) TABS Take 1 tablet by mouth daily.   Yes Historical Provider, MD  omega-3 acid ethyl esters (LOVAZA) 1 G capsule Take 1 g by mouth 2 (two) times daily.   Yes Historical Provider, MD  pravastatin (  PRAVACHOL) 80 MG tablet Take 80 mg by mouth at bedtime.   Yes Historical Provider, MD   Physical Exam: Filed Vitals:   11/13/12 0530 11/13/12 0545 11/13/12 0600 11/13/12 0615  BP: 152/66 158/50 159/47 170/66  Pulse: 66 74 70 75  Temp:      TempSrc:      Resp: 20 19 24 26   SpO2: 94% 95% 93% 93%     General:  Well-developed and nourished.  Eyes: Anicteric no pallor.  ENT: No discharge from ears eyes nose mouth.  Neck: No mass felt.  Cardiovascular: S1-S2 heard.  Respiratory: No rhonchi or crepitations.  Abdomen: Soft  nontender bowel sounds present.  Skin: No rash.  Musculoskeletal: Bilateral lower extremity edema.  Psychiatric: Appears normal.  Neurologic: Alert awake oriented to name only. Moves all extremities.  Labs on Admission:  Basic Metabolic Panel:  Recent Labs Lab 11/12/12 2034  NA 140  K 3.9  CL 99  CO2 29  GLUCOSE 71  BUN 46*  CREATININE 1.72*  CALCIUM 9.1   Liver Function Tests:  Recent Labs Lab 11/12/12 2034  AST 30  ALT 13  ALKPHOS 26*  BILITOT 0.2*  PROT 7.5  ALBUMIN 3.4*   No results found for this basename: LIPASE, AMYLASE,  in the last 168 hours No results found for this basename: AMMONIA,  in the last 168 hours CBC:  Recent Labs Lab 11/12/12 2034  WBC 12.0*  NEUTROABS 9.2*  HGB 12.7  HCT 38.7  MCV 79.0  PLT 351   Cardiac Enzymes: No results found for this basename: CKTOTAL, CKMB, CKMBINDEX, TROPONINI,  in the last 168 hours  BNP (last 3 results)  Recent Labs  11/13/12 0208  PROBNP 1362.0*   CBG: No results found for this basename: GLUCAP,  in the last 168 hours  Radiological Exams on Admission: Dg Chest 2 View  11/13/2012   *RADIOLOGY REPORT*  Clinical Data: Shortness of breath.  CHEST - 2 VIEW  Comparison: 11/05/2008  Findings: Mild cardiomegaly and vascular congestion.  No overt edema.  No focal opacities or effusions.  No acute bony abnormality.  IMPRESSION: Cardiomegaly, vascular congestion.   Original Report Authenticated By: Charlett Nose, M.D.    EKG: Independently reviewed. Normal sinus rhythm.  Assessment/Plan Principal Problem:   CHF (congestive heart failure) Active Problems:   Diabetes mellitus   HTN (hypertension)   Hyperlipidemia   Dementia   1. CHF - I have placed patient on Lasix 40 mg IV daily. Closely follow intake output and daily weight, with metabolic panel. Check 2-D echo. Check troponin. 2. Renal failure - last creatinine available in the system was normal in 2008. Patient probably may be having an acute on  chronic component. Check UA. If creatinine worsens may have to hold her lisinopril or even Lasix. 3. Diabetes mellitus type 2 - continue home medications and closely follow CBGs. 4. Hypertension - see #2. Otherwise continue home medications. 5. Dementia - continue home medications. 6. Mild leukocytosis - patient is afebrile. Check UA. 7. Hyperlipidemia - continue home medications.    Code Status: Full code as confirmed with patient's daughter.  Family Communication: Patient's daughter.  Disposition Plan: Admit to inpatient.    Nicole Hafley N. [facility] Pager [phone].  If 7PM-7AM, please contact night-coverage [email or web address] Password [facility] 11/13/2012, 6:34 AM

## 2012-11-13 NOTE — Progress Notes (Signed)
Utilization review completed.  P.J. Jasimine Simms,RN,BSN Case Manager [phone]  

## 2012-11-13 NOTE — Progress Notes (Signed)
Pt oriented to unit, pt on bed alarm, heart failure education given to [facility], will monitor

## 2012-11-13 NOTE — ED Notes (Signed)
While cleaning patient, patient does have some skin breakdown beginning in stomach flaps.   It is red and blanchable, but "peeling".

## 2012-11-13 NOTE — Evaluation (Signed)
Physical Therapy Evaluation Patient Details Name: RAYVEN RETTIG MRN: [id number] DOB: 10/13/1929 Today's Date: 11/13/2012 Time: 1550-1610 PT Time Calculation (min): 20 min  PT Assessment / Plan / Recommendation Clinical Impression  Pt functioning near baseline. Pt with decreased activity tolerance compared to a week ago however has improved since yesterday per family. Family and hired help able to provide appropriate assist/supervision to pt for safe d/c home to her apartment once medically stable.    PT Assessment  Patient needs continued PT services    Follow Up Recommendations  No PT follow up;Supervision - Intermittent    Does the patient have the potential to tolerate intense rehabilitation      Barriers to Discharge        Equipment Recommendations  None recommended by PT    Recommendations for Other Services     Frequency Min 2X/week    Precautions / Restrictions Precautions Precautions: Fall Precaution Comments: known dementia Restrictions Weight Bearing Restrictions: No   Pertinent Vitals/Pain Pt denies pain. Noted bilat LE edema      Mobility  Bed Mobility Bed Mobility: Supine to Sit Supine to Sit: 6: Modified independent (Device/Increase time);With rails;HOB elevated Transfers Transfers: Sit to Stand;Stand to Sit Sit to Stand: 5: Supervision;With upper extremity assist;From bed Stand to Sit: 5: Supervision;Without upper extremity assist;With armrests;To chair/3-in-1 Details for Transfer Assistance: v/c's for safe hand placement Ambulation/Gait Ambulation/Gait Assistance: 4: Min guard Ambulation Distance (Feet): 100 Feet Assistive device: Rolling walker Ambulation/Gait Assistance Details: v/c's to decrease pace, no episodes of LOB Gait Pattern: Step-through pattern Stairs: No    Exercises     PT Diagnosis: Difficulty walking  PT Problem List: Decreased activity tolerance;Decreased mobility PT Treatment Interventions: Gait training;Functional mobility  training;Therapeutic activities;Therapeutic exercise     PT Goals(Current goals can be found in the care plan section) Acute Rehab PT Goals PT Goal Formulation: With patient/family Time For Goal Achievement: 11/27/12 Potential to Achieve Goals: Good  Visit Information  Last PT Received On: 11/13/12 Assistance Needed: +1 History of Present Illness: Pt admitted with bilat LE edema/CHF exacerbation.       Prior Functioning  Home Living Family/patient expects to be discharged to:: Private residence Living Arrangements: Alone Available Help at Discharge: Family;Available PRN/intermittently (hired CNA 6hours/6days wk) Type of Home: Apartment ([place] apartment [place]) Home Access: Level entry Home Layout: One level Home Equipment: Walker - 4 wheels;Tub bench;Hand held shower head;Grab bars - tub/shower Prior Function Level of Independence: Needs assistance Gait / Transfers Assistance Needed: uses RW ADL's / Homemaking Assistance Needed: assist for cooking, cleaning, grocery shopping, bathing and taking medication Communication Communication: No difficulties Dominant Hand: Right    Cognition  Cognition Arousal/Alertness: Awake/alert Behavior During Therapy: WFL for tasks assessed/performed Overall Cognitive Status: History of cognitive impairments - at baseline    Extremity/Trunk Assessment Upper Extremity Assessment Upper Extremity Assessment: Overall WFL for tasks assessed Lower Extremity Assessment Lower Extremity Assessment: Overall WFL for tasks assessed Cervical / Trunk Assessment Cervical / Trunk Assessment: Normal   Balance    End of Session PT - End of Session Equipment Utilized During Treatment: Gait belt Activity Tolerance: Patient tolerated treatment well Patient left: in chair;with call bell/phone within reach;with family/visitor present Nurse Communication: Mobility status  GP     Marcene Brawn 11/13/2012, 4:58 PM  Lewis Shock, PT, DPT Pager  #: [phone] Office #: [phone]

## 2012-11-13 NOTE — ED Notes (Signed)
MD at bedside. 

## 2012-11-13 NOTE — ED Notes (Signed)
Patient noted on monitor to be in A-flutter. No hx A-flutter/A-fib. EKG done and shown to Dr. Ila Mcgill. Patient AAO@baseline  and is without any c/o CP, SOB, dizziness or palpitations.

## 2012-11-13 NOTE — Care Management Note (Signed)
Patient is a pending start-of-care with [name].  Dr. Florentina Jenny is referring physician. HHRN.

## 2012-11-13 NOTE — ED Notes (Signed)
Care transferred and report given to Amy, RN. Patient transferred to POD C-28 to await for inpatient bed assignment. Daughter remains at bedside.

## 2012-11-13 NOTE — ED Notes (Signed)
Patient transported to X-ray 

## 2012-11-13 NOTE — Progress Notes (Signed)
Patient admitted few hours ago for swelling in her lower extremities consistent with edema, she was admitted with a running diagnosis of congestive heart failure. This certainly could be more right-sided as she has few rales and minimal shortness of breath, agree with diuresis. I agree with the echogram along with Lasix.  I have also noticed she has renal insufficiency with last creatinine in our file from several years ago. I will discontinue her ACE inhibitor, will check urine electrolytes and repeat BMP tomorrow with diuresis.   She also has dementia along with diabetes mellitus, remains at risk for delirium.

## 2012-11-13 NOTE — Care Management Note (Signed)
    Page 1 of 1   11/13/2012     3:12:51 PM   CARE MANAGEMENT NOTE 11/13/2012  Patient:  Debbie Chambers, Debbie Chambers   Account Number:  [id number]  Date Initiated:  11/13/2012  Documentation initiated by:  [facility]  Subjective/Objective Assessment:   79 y.o. female with known history of dementia, diabetes mellitus type 2, hypertension and hyperlipidemia was noticed to have increased lower extremity edema over the last one week     Action/Plan:   Diurese/ PCP, assess for [facility] needs   Anticipated DC Date:  11/16/2012   Anticipated DC Plan:  HOME W HOME HEALTH SERVICES      DC Planning Services  CM consult  HF Clinic      Choice offered to / List presented to:             Status of service:   Medicare Important Message given?   (If response is "NO", the following Medicare IM given date fields will be blank) Date Medicare IM given:   Date Additional Medicare IM given:    Discharge Disposition:    Per UR Regulation:    If discussed at Long Length of Stay Meetings, dates discussed:    Comments:  11/13/12.Marland KitchenMarland KitchenOletta Cohn, RN, BSN, [employer] [phone] Spoke with pt at bedside regarding choosing a PCP for future appts. Gave pt list of PCPs in this area. Also have sticky note in to admitting MD for HF clinic consult and email in to HF clinic RN and NP. NCM will follow up.

## 2012-11-13 NOTE — ED Notes (Signed)
Patient presents from home with c/o bilateral leg swelling and increased SOB. Pt with hx dementia. Hx provided by daughter at bedside. Daughter reports that she usually has SOB but pt seems to be more short of breath than normal. Denies any CP. Pt AAO@baseline .

## 2012-11-13 NOTE — ED Provider Notes (Signed)
History    CSN: [id number] Arrival date & time 11/12/12  2018  First MD Initiated Contact with Patient 11/13/12 0319     Chief Complaint  Patient presents with  . Leg Swelling   (Consider location/radiation/quality/duration/timing/severity/associated sxs/prior Treatment) HPI Hx per PT and daughter bedside - over the last few days has developed LE swelling and inc DOE. She is unable to lay down flat without getting SOB. No associated CP, fever, cough.  Symptoms improved with rest and sitting up.  Mod in severity, no h/o same. No h/o MI, CHF or known CAD. She lives at home and uses a walker, has mild dementia.    Past Medical History  Diagnosis Date  . Dementia   . Hypertension   . Diabetes mellitus without complication   . Hypercholesterolemia    Past Surgical History  Procedure Laterality Date  . Hand surgery     No family history on file. History  Substance Use Topics  . Smoking status: Never Smoker   . Smokeless tobacco: Never Used  . Alcohol Use: No   OB History   Grav Para Term Preterm Abortions TAB SAB Ect Mult Living                 Review of Systems  Constitutional: Negative for fever and chills.  HENT: Negative for neck pain and neck stiffness.   Eyes: Negative for pain.  Respiratory: Positive for shortness of breath.   Cardiovascular: Positive for leg swelling. Negative for chest pain.  Gastrointestinal: Negative for abdominal pain.  Genitourinary: Negative for dysuria.  Musculoskeletal: Negative for back pain.  Skin: Negative for rash.  Neurological: Negative for headaches.  All other systems reviewed and are negative.    Allergies  Penicillins  Home Medications   Current Outpatient Rx  Name  Route  Sig  Dispense  Refill  . amLODipine (NORVASC) 10 MG tablet   Oral   Take 10 mg by mouth daily.         Marland Kitchen aspirin EC 81 MG tablet   Oral   Take 81 mg by mouth daily.         . Cholecalciferol (VITAMIN D PO)   Oral   Take 1 tablet by  mouth daily.         . cloNIDine (CATAPRES) 0.2 MG tablet   Oral   Take 0.2 mg by mouth 2 (two) times daily.         Marland Kitchen donepezil (ARICEPT) 10 MG tablet   Oral   Take 10 mg by mouth at bedtime.         . fenofibrate 160 MG tablet   Oral   Take 160 mg by mouth daily.         . ferrous sulfate 325 (65 FE) MG tablet   Oral   Take 325 mg by mouth daily with breakfast.         . hydrochlorothiazide (HYDRODIURIL) 25 MG tablet   Oral   Take 12.5 mg by mouth daily.         . insulin aspart (NOVOLOG) 100 UNIT/ML injection   Subcutaneous   Inject 60 Units into the skin 2 (two) times daily before a meal.         . lisinopril (PRINIVIL,ZESTRIL) 40 MG tablet   Oral   Take 40 mg by mouth daily.         . metoprolol (LOPRESSOR) 50 MG tablet   Oral   Take 25 mg by mouth  daily.         . Multiple Vitamin (MULTIVITAMIN WITH MINERALS) TABS   Oral   Take 1 tablet by mouth daily.         Marland Kitchen omega-3 acid ethyl esters (LOVAZA) 1 G capsule   Oral   Take 1 g by mouth 2 (two) times daily.         . pravastatin (PRAVACHOL) 80 MG tablet   Oral   Take 80 mg by mouth at bedtime.          BP 166/54  Pulse 70  Temp(Src) 97.5 F (36.4 C) (Oral)  Resp 21  SpO2 94% Physical Exam  Constitutional: She is oriented to person, place, and time. She appears well-developed and well-nourished.  HENT:  Head: Normocephalic and atraumatic.  Eyes: EOM are normal. Pupils are equal, round, and reactive to light.  Neck: Neck supple.  Cardiovascular: Normal rate, regular rhythm and intact distal pulses.   Pulmonary/Chest: Effort normal and breath sounds normal. No respiratory distress. She exhibits no tenderness.  Abdominal: Soft. There is no tenderness.  Musculoskeletal: Normal range of motion.  symmetric LE edmea 1-2 plus, no cords or calf tenderness.  Neurological: She is alert and oriented to person, place, and time.  Skin: Skin is warm and dry.    ED Course  Procedures  (including critical care time) Labs Reviewed  CBC WITH DIFFERENTIAL - Abnormal; Notable for the following:    WBC 12.0 (*)    MCH 25.9 (*)    Neutro Abs 9.2 (*)    Monocytes Absolute 1.1 (*)    All other components within normal limits  COMPREHENSIVE METABOLIC PANEL - Abnormal; Notable for the following:    BUN 46 (*)    Creatinine, Ser 1.72 (*)    Albumin 3.4 (*)    Alkaline Phosphatase 26 (*)    Total Bilirubin 0.2 (*)    GFR calc non Af Amer 27 (*)    GFR calc Af Amer 31 (*)    All other components within normal limits  PRO B NATRIURETIC PEPTIDE - Abnormal; Notable for the following:    Pro B Natriuretic peptide (BNP) 1362.0 (*)    All other components within normal limits   Dg Chest 2 View  11/13/2012   *RADIOLOGY REPORT*  Clinical Data: Shortness of breath.  CHEST - 2 VIEW  Comparison: 11/05/2008  Findings: Mild cardiomegaly and vascular congestion.  No overt edema.  No focal opacities or effusions.  No acute bony abnormality.  IMPRESSION: Cardiomegaly, vascular congestion.   Original Report Authenticated By: Charlett Nose, M.D.   1. CHF, acute     Date: 11/13/2012  Rate: 64  Rhythm: normal sinus rhythm  QRS Axis: left  Intervals: normal  ST/T Wave abnormalities: nonspecific ST/T changes  Conduction Disutrbances:none  Narrative Interpretation:   Old EKG Reviewed: none available  IV Lasix  D/w DR Toniann Fail, plan MED admit  MDM  SOB, LE swelling, elevated BNP  ECG, labs, CXR  MED admit   Sunnie Nielsen, MD 11/13/12 2040

## 2012-11-14 DIAGNOSIS — I5031 Acute diastolic (congestive) heart failure: Secondary | ICD-10-CM

## 2012-11-14 DIAGNOSIS — R609 Edema, unspecified: Secondary | ICD-10-CM

## 2012-11-14 LAB — CBC
Hemoglobin: 11.2 g/dL — ABNORMAL LOW (ref 12.0–15.0)
MCH: 25.1 pg — ABNORMAL LOW (ref 26.0–34.0)
MCHC: 32.2 g/dL (ref 30.0–36.0)
RDW: 14.6 % (ref 11.5–15.5)

## 2012-11-14 LAB — URINE CULTURE
Colony Count: NO GROWTH
Culture: NO GROWTH

## 2012-11-14 LAB — BASIC METABOLIC PANEL
BUN: 45 mg/dL — ABNORMAL HIGH (ref 6–23)
Calcium: 8.5 mg/dL (ref 8.4–10.5)
Creatinine, Ser: 1.68 mg/dL — ABNORMAL HIGH (ref 0.50–1.10)
GFR calc Af Amer: 32 mL/min — ABNORMAL LOW (ref >90)
GFR calc non Af Amer: 28 mL/min — ABNORMAL LOW (ref >90)
Glucose, Bld: 143 mg/dL — ABNORMAL HIGH (ref 70–99)

## 2012-11-14 LAB — GLUCOSE, CAPILLARY: Glucose-Capillary: 143 mg/dL — ABNORMAL HIGH (ref 70–99)

## 2012-11-14 MED ORDER — CLONIDINE HCL 0.1 MG PO TABS
0.1000 mg | ORAL_TABLET | Freq: Two times a day (BID) | ORAL | Status: DC
  Administered 2012-11-14 – 2012-11-15 (×3): 0.1 mg via ORAL
  Filled 2012-11-14 (×5): qty 1

## 2012-11-14 MED ORDER — METOPROLOL TARTRATE 25 MG PO TABS
25.0000 mg | ORAL_TABLET | Freq: Two times a day (BID) | ORAL | Status: DC
Start: 2012-11-14 — End: 2012-11-16
  Administered 2012-11-14 – 2012-11-16 (×4): 25 mg via ORAL
  Filled 2012-11-14 (×5): qty 1

## 2012-11-14 NOTE — Progress Notes (Signed)
  Echocardiogram 2D Echocardiogram has been performed.  Debbie Chambers 11/14/2012, 10:01 AM

## 2012-11-14 NOTE — Progress Notes (Signed)
*  PRELIMINARY RESULTS* Vascular Ultrasound Lower extremity venous duplex has been completed.  Preliminary findings: no obvious DVT noted. Cannot clearly visualize calf veins.    Farrel Demark, RDMS, RVT  11/14/2012, 10:14 AM

## 2012-11-14 NOTE — Progress Notes (Signed)
rec Pt back    From transport. Call       Bed bell             In reach

## 2012-11-14 NOTE — Progress Notes (Signed)
OT Cancellation Note  Patient Details Name: Debbie Chambers MRN: [id number] DOB: [birth year]   Cancelled Treatment:    Reason Eval/Treat Not Completed: Patient at procedure or test/ unavailable. Will try again as time allows.  Roney Mans [facility] 11/14/2012, 9:53 AM

## 2012-11-14 NOTE — Progress Notes (Signed)
[facility] PROGRESS NOTE  Debbie Chambers:[id number] DOB: 07/17/1929 DOA: 11/13/2012 PCP: Provider Not In System  Assessment/Plan: Acute diastolic CHF -Echo 11/14/2012 showed EF 60-65% -neg 1735cc last 24 hrs/ neg 2635cc for admission -neg 0.8kg for admission -Continue IV furosemide -Daily BMP Diabetes mellitus type 2 -Hemoglobin A1c 6.1 -Discontinue Accu-Cheks Hypertension -Increase metoprolol tartrate 25 mg twice a day -Decrease clonidine to 0.1 mg twice a day -Continue hydralazine Dementia -Continue Aricept Leukocytosis -Resolved -Urinalysis does not suggest UTI Microcytic anemia -Check iron studies    Family Communication:   Pt at beside Disposition Plan:   Home when medically stable     Procedures/Studies: Dg Chest 2 View  11/13/2012   *RADIOLOGY REPORT*  Clinical Data: Shortness of breath.  CHEST - 2 VIEW  Comparison: 11/05/2008  Findings: Mild cardiomegaly and vascular congestion.  No overt edema.  No focal opacities or effusions.  No acute bony abnormality.  IMPRESSION: Cardiomegaly, vascular congestion.   Original Report Authenticated By: Charlett Nose, M.D.         Subjective: Patient is pleasantly confused. She denies any chest pain, shortness breath, vomiting, diarrhea. She ate well today. No fevers or chills.  Objective: Filed Vitals:   11/13/12 1517 11/13/12 2047 11/14/12 0449 11/14/12 1403  BP: 148/74 141/45 128/58 133/49  Pulse: 75 78 73 60  Temp: 98.4 F (36.9 C) 98.1 F (36.7 C) 97.9 F (36.6 C) 97.4 F (36.3 C)  TempSrc: Oral Oral Oral Oral  Resp: 18 20 21 20   Height:      Weight:   106.1 kg (233 lb 14.5 oz)   SpO2: 97% 95% 92% 91%    Intake/Output Summary (Last 24 hours) at 11/14/12 1614 Last data filed at 11/14/12 1537  Gross per 24 hour  Intake    680 ml  Output   1800 ml  Net  -1120 ml   Weight change:  Exam:   General:  Pt is alert, follows commands appropriately, not in acute distress  HEENT: No icterus, No  thrush,[place]/AT  Cardiovascular: RRR, S1/S2, no rubs, no gallops  Respiratory: Diminished breath sounds at the bases without any crackles.  Abdomen: Soft/+BS, non tender, non distended, no guarding  Extremities: 2+LE edema, No lymphangitis, No petechiae, No rashes, no synovitis  Data Reviewed: Basic Metabolic Panel:  Recent Labs Lab 11/12/12 2034 11/13/12 1035 11/14/12 0525  NA 140 142 139  K 3.9 3.9 3.8  CL 99 100 99  CO2 29 27 32  GLUCOSE 71 87 143*  BUN 46* 43* 45*  CREATININE 1.72* 1.73* 1.68*  CALCIUM 9.1 9.2 8.5   Liver Function Tests:  Recent Labs Lab 11/12/12 2034  AST 30  ALT 13  ALKPHOS 26*  BILITOT 0.2*  PROT 7.5  ALBUMIN 3.4*   No results found for this basename: LIPASE, AMYLASE,  in the last 168 hours No results found for this basename: AMMONIA,  in the last 168 hours CBC:  Recent Labs Lab 11/12/12 2034 11/13/12 1035 11/14/12 0525  WBC 12.0* 10.0 8.9  NEUTROABS 9.2* 6.6  --   HGB 12.7 12.5 11.2*  HCT 38.7 39.0 34.8*  MCV 79.0 78.3 77.9*  PLT 351 284 245   Cardiac Enzymes:  Recent Labs Lab 11/13/12 1035  TROPONINI <0.30   : No components found with this basename: POCBNP,  CBG:  Recent Labs Lab 11/13/12 1052 11/13/12 1620 11/13/12 2112 11/14/12 0627 11/14/12 1110  GLUCAP 81 128* 210* 111* 143*    Recent Results (from the past 240  hour(s))  URINE CULTURE     Status: None   Collection Time    11/13/12 12:31 PM      Result Value Range Status   Specimen Description URINE, RANDOM   Final   Special Requests NONE   Final   Culture  Setup Time 11/13/2012 17:19   Final   Colony Count NO GROWTH   Final   Culture NO GROWTH   Final   Report Status 11/14/2012 FINAL   Final     Scheduled Meds: . amLODipine  10 mg Oral Daily  . aspirin EC  325 mg Oral Daily  . atorvastatin  20 mg Oral q1800  . cloNIDine  0.1 mg Oral BID  . donepezil  10 mg Oral QHS  . fenofibrate  160 mg Oral Daily  . ferrous sulfate  325 mg Oral Q breakfast  .  furosemide  40 mg Intravenous Daily  . hydrALAZINE  25 mg Oral Q8H  . insulin aspart  0-9 Units Subcutaneous TID WC  . metoprolol  25 mg Oral BID  . multivitamin with minerals  1 tablet Oral Daily  . omega-3 acid ethyl esters  1 g Oral BID  . sodium chloride  3 mL Intravenous Q12H   Continuous Infusions:    Erlinda Solinger, DO  [facility] Pager [phone]  If 7PM-7AM, please contact night-coverage [email or web address] Password [email or web address] 11/14/2012, 4:14 PM   LOS: 1 day

## 2012-11-15 DIAGNOSIS — E785 Hyperlipidemia, unspecified: Secondary | ICD-10-CM

## 2012-11-15 LAB — BASIC METABOLIC PANEL
BUN: 44 mg/dL — ABNORMAL HIGH (ref 6–23)
CO2: 31 mEq/L (ref 19–32)
Chloride: 99 mEq/L (ref 96–112)
Creatinine, Ser: 1.6 mg/dL — ABNORMAL HIGH (ref 0.50–1.10)
GFR calc Af Amer: 34 mL/min — ABNORMAL LOW (ref >90)
Potassium: 3.6 mEq/L (ref 3.5–5.1)

## 2012-11-15 LAB — IRON AND TIBC
Iron: 65 ug/dL (ref 42–135)
Saturation Ratios: 18 % — ABNORMAL LOW (ref 20–55)
UIBC: 302 ug/dL (ref 125–400)

## 2012-11-15 LAB — GLUCOSE, CAPILLARY
Glucose-Capillary: 153 mg/dL — ABNORMAL HIGH (ref 70–99)
Glucose-Capillary: 190 mg/dL — ABNORMAL HIGH (ref 70–99)
Glucose-Capillary: 169 mg/dL — ABNORMAL HIGH (ref 70–99)

## 2012-11-15 LAB — FERRITIN: Ferritin: 120 ng/mL (ref 10–291)

## 2012-11-15 MED ORDER — FUROSEMIDE 10 MG/ML IJ SOLN
40.0000 mg | Freq: Two times a day (BID) | INTRAMUSCULAR | Status: DC
  Administered 2012-11-15 – 2012-11-16 (×2): 40 mg via INTRAVENOUS
  Filled 2012-11-15 (×3): qty 4

## 2012-11-15 MED ORDER — ENOXAPARIN SODIUM 30 MG/0.3ML ~~LOC~~ SOLN
30.0000 mg | SUBCUTANEOUS | Status: DC
  Filled 2012-11-15: qty 0.3

## 2012-11-15 MED ORDER — ENOXAPARIN SODIUM 40 MG/0.4ML ~~LOC~~ SOLN
40.0000 mg | SUBCUTANEOUS | Status: DC
  Administered 2012-11-15 – 2012-11-16 (×2): 40 mg via SUBCUTANEOUS
  Filled 2012-11-15 (×3): qty 0.4

## 2012-11-15 NOTE — Progress Notes (Signed)
Physical Therapy Treatment Patient Details Name: Debbie Chambers MRN: [id number] DOB: 21-May-1930 Today's Date: 11/15/2012 Time:  -     PT Assessment / Plan / Recommendation  PT Comments   Pt pleasant & willing to participate in therapy.  She was able to increase ambulation distance today.  She does become dyspneic on exertion however 02 sats remained >90% RA.      Follow Up Recommendations  No PT follow up;Supervision - Intermittent     Does the patient have the potential to tolerate intense rehabilitation     Barriers to Discharge        Equipment Recommendations  None recommended by PT    Recommendations for Other Services    Frequency Min 2X/week   Progress towards PT Goals Progress towards PT goals: Progressing toward goals  Plan Current plan remains appropriate    Precautions / Restrictions Precautions Precautions: Fall Precaution Comments: known dementia Restrictions Weight Bearing Restrictions: No   Pertinent Vitals/Pain RN came to room after pt ambulated & stated HR increased to 140's.  Pt asymptomatic.      Mobility  Bed Mobility Bed Mobility: Not assessed Supine to Sit: 5: Supervision;HOB elevated;With rails Transfers Transfers: Sit to Stand;Stand to Sit Sit to Stand: 5: Supervision;With upper extremity assist;With armrests;From chair/3-in-1 Stand to Sit: 5: Supervision;With upper extremity assist;With armrests;To chair/3-in-1 Ambulation/Gait Ambulation/Gait Assistance: 4: Min guard Ambulation Distance (Feet): 140 Feet Assistive device: Rolling walker Ambulation/Gait Assistance Details: VC's for safe pace, body positioning inside RW, & pursed lip breathing.  Overall pt is steady but she becomes dyspneic on exertion however 02 sats remained >90%RA.   Gait Pattern: Step-through pattern;Decreased stride length Stairs: No Wheelchair Mobility Wheelchair Mobility: No         PT Goals (current goals can now be found in the care plan section) Acute Rehab PT  Goals Patient Stated Goal: Pt did not state but requested to go to the bathroom.  Visit Information  Last PT Received On: 11/15/12 Assistance Needed: +1 History of Present Illness: Pt admitted with bilat LE edema/CHF exacerbation.    Subjective Data  Patient Stated Goal: Pt did not state but requested to go to the bathroom.   Cognition  Cognition Arousal/Alertness: Awake/alert Behavior During Therapy: WFL for tasks assessed/performed Overall Cognitive Status: History of cognitive impairments - at baseline Memory: Decreased short-term memory    Balance  Balance Balance Assessed: Yes Static Standing Balance Static Standing - Balance Support: Right upper extremity supported;Left upper extremity supported Static Standing - Level of Assistance: 5: Stand by assistance  End of Session PT - End of Session Equipment Utilized During Treatment: Gait belt Activity Tolerance: Patient tolerated treatment well Patient left: in chair;with call bell/phone within reach Nurse Communication: Mobility status   GP     Lara Mulch 11/15/2012, 10:56 AM    Verdell Face, PTA [phone] 11/15/2012

## 2012-11-15 NOTE — Progress Notes (Signed)
CM tech informed pt's heart rate 140-120 Asymptomatic.  BP138/64.  [facility]ona primary nurse made aware. Amanda Pea, Charity fundraiser.

## 2012-11-15 NOTE — Evaluation (Signed)
Occupational Therapy Evaluation Patient Details Name: Debbie Chambers MRN: [id number] DOB: 08/28/1929 Today's Date: 11/15/2012 Time: 4098-1191 OT Time Calculation (min): 23 min  OT Assessment / Plan / Recommendation History of present illness Pt admitted with bilat LE edema/CHF exacerbation.   Clinical Impression   Pt currently supervision level for selfcare tasks and use of the RW.  No family available this session to determine if this is pt's baseline function but do feel she will benefit from acute care OT services to reach modified independent level for toileting and basic selfcare tasks since pt does not have 24 hour supervision.  Will continue to follow.    OT Assessment  Patient needs continued OT Services    Follow Up Recommendations  No OT follow up       Equipment Recommendations  3 in 1 bedside comode       Frequency  Min 2X/week    Precautions / Restrictions Precautions Precautions: Fall Precaution Comments: known dementia Restrictions Weight Bearing Restrictions: No   Pertinent Vitals/Pain O2 sats 96% on room air with activity, HR increasing to 120 with mobility but down to 96 post mobility in the room    ADL  Eating/Feeding: Performed;Independent Where Assessed - Eating/Feeding: Chair Grooming: Performed;Supervision/safety Where Assessed - Grooming: Unsupported standing Upper Body Bathing: Simulated;Set up Where Assessed - Upper Body Bathing: Unsupported sitting Lower Body Bathing: Simulated;Supervision/safety Where Assessed - Lower Body Bathing: Supported sit to stand Upper Body Dressing: Simulated;Set up Where Assessed - Upper Body Dressing: Unsupported sitting Lower Body Dressing: Simulated;Supervision/safety Where Assessed - Lower Body Dressing: Supported sit to stand Toilet Transfer: Buyer, retail Method: Other (comment) (ambulate with RW) Acupuncturist: Comfort height toilet;Grab bars Toileting - Clothing  Manipulation and Hygiene: Performed;Supervision/safety Where Assessed - Engineer, mining and Hygiene: Sit to stand from 3-in-1 or toilet Tub/Shower Transfer Method: Not assessed Equipment Used: Rolling walker Transfers/Ambulation Related to ADLs: Pt close supervision for mobility using the RW. ADL Comments: Pt with decreased memeory and not able to provide much information in the way of prior level of function.  Overall supervision level for selfcare tasks and toileting.    OT Diagnosis: Generalized weakness  OT Problem List: Decreased strength;Impaired balance (sitting and/or standing);Decreased safety awareness OT Treatment Interventions: Self-care/ADL training;DME and/or AE instruction;Therapeutic activities;Patient/family education   OT Goals(Current goals can be found in the care plan section) Acute Rehab OT Goals Patient Stated Goal: Pt did not state but requested to go to the bathroom. OT Goal Formulation: With patient Time For Goal Achievement: 11/29/12 Potential to Achieve Goals: Good  Visit Information  Last OT Received On: 11/15/12 Assistance Needed: +1 History of Present Illness: Pt admitted with bilat LE edema/CHF exacerbation.       Prior Functioning     Home Living Family/patient expects to be discharged to:: Private residence Living Arrangements: Alone Available Help at Discharge: Family;Available PRN/intermittently (hired CNA 6hours/6days wk) Type of Home: Apartment ([place]) Home Access: Level entry Home Layout: One level Home Equipment: Walker - 4 wheels;Tub bench;Hand held shower head;Grab bars - tub/shower Prior Function Level of Independence: Needs assistance Gait / Transfers Assistance Needed: uses RW ADL's / Homemaking Assistance Needed: assist for cooking, cleaning, grocery shopping, bathing and taking medication Communication Communication: No difficulties Dominant Hand: Right         Vision/Perception Vision -  History Baseline Vision: No visual deficits Patient Visual Report: No change from baseline Vision - Assessment Eye Alignment: Within Functional Limits Vision Assessment: Vision not  tested Perception Perception: Within Functional Limits Praxis Praxis: Intact   Cognition  Cognition Arousal/Alertness: Awake/alert Behavior During Therapy: WFL for tasks assessed/performed Overall Cognitive Status: History of cognitive impairments - at baseline Memory: Decreased short-term memory    Extremity/Trunk Assessment Upper Extremity Assessment Upper Extremity Assessment: Overall WFL for tasks assessed Cervical / Trunk Assessment Cervical / Trunk Assessment: Normal     Mobility Bed Mobility Bed Mobility: Supine to Sit Supine to Sit: 5: Supervision;HOB elevated;With rails Transfers Transfers: Sit to Stand Sit to Stand: 5: Supervision;From bed;With upper extremity assist Stand to Sit: 5: Supervision;To toilet;With upper extremity assist        Balance Balance Balance Assessed: Yes Static Standing Balance Static Standing - Balance Support: Right upper extremity supported;Left upper extremity supported Static Standing - Level of Assistance: 5: Stand by assistance   End of Session OT - End of Session Equipment Utilized During Treatment: Rolling walker Activity Tolerance: Patient tolerated treatment well Patient left: in chair;with call bell/phone within reach Nurse Communication: Mobility status     Tajay Muzzy OTR/L Pager number [phone] 11/15/2012, 8:38 AM

## 2012-11-15 NOTE — Plan of Care (Signed)
Problem: Phase I Progression Outcomes Goal: EF % per last Echo/documented,Core Reminder form on chart Outcome: Completed/Met Date Met:  11/15/12 EF 55-60%(11-14-12)

## 2012-11-15 NOTE — Progress Notes (Signed)
[facility] PROGRESS NOTE  Debbie Chambers ZOX:[id number] DOB: 1930/05/08 DOA: 11/13/2012 PCP: Provider Not In System  Assessment/Plan: Acute diastolic CHF  -Echo 11/14/2012 showed EF 60-65%  -neg 2516cc for admission--I/O not accurate last 24 hrs -neg 1.7kg for admission  -increase IV furosemide to bid -Daily BMP  Diabetes mellitus type 2  -Hemoglobin A1c 6.1  -Discontinue Accu-Cheks  Hypertension  -Increase metoprolol tartrate 25 mg twice a day  -Decrease clonidine to 0.1 mg twice a day  -Continue hydralazine  Dementia  -Continue Aricept  Leukocytosis  -Resolved  -Urinalysis does not suggest UTI  Microcytic anemia  -Iron saturation 18%, ferritin 120 Family Communication: Updated daughter Disposition Plan: Home when medically stable              Procedures/Studies: Dg Chest 2 View  11/13/2012   *RADIOLOGY REPORT*  Clinical Data: Shortness of breath.  CHEST - 2 VIEW  Comparison: 11/05/2008  Findings: Mild cardiomegaly and vascular congestion.  No overt edema.  No focal opacities or effusions.  No acute bony abnormality.  IMPRESSION: Cardiomegaly, vascular congestion.   Original Report Authenticated By: Charlett Nose, M.D.         Subjective: Patient denies any fevers, chills, chest pain, shortness breath, nausea, vomiting, diarrhea, abdominal pain. No dysuria or hematuria.  Objective: Filed Vitals:   11/15/12 0534 11/15/12 0829 11/15/12 1046 11/15/12 1300  BP: 130/58  136/60 126/56  Pulse: 70 96 100 98  Temp: 98.5 F (36.9 C)   97.5 F (36.4 C)  TempSrc: Oral   Oral  Resp: 18   18  Height:      Weight: 105.28 kg (232 lb 1.6 oz)     SpO2: 94% 97%  97%    Intake/Output Summary (Last 24 hours) at 11/15/12 1410 Last data filed at 11/15/12 1300  Gross per 24 hour  Intake   1280 ml  Output    701 ml  Net    579 ml   Weight change: -1.678 kg (-3 lb 11.2 oz) Exam:   General:  Pt is alert, follows commands appropriately, not in acute  distress  HEENT: No icterus, No thrush, [place]/AT  Cardiovascular: RRR, S1/S2, no rubs, no gallops  Respiratory: CTA bilaterally, no wheezing, no crackles, no rhonchi  Abdomen: Soft/+BS, non tender, non distended, no guarding  Extremities: 1+ LE edema, No lymphangitis, No petechiae, No rashes, no synovitis  Data Reviewed: Basic Metabolic Panel:  Recent Labs Lab 11/12/12 2034 11/13/12 1035 11/14/12 0525 11/15/12 0635  NA 140 142 139 140  K 3.9 3.9 3.8 3.6  CL 99 100 99 99  CO2 29 27 32 31  GLUCOSE 71 87 143* 140*  BUN 46* 43* 45* 44*  CREATININE 1.72* 1.73* 1.68* 1.60*  CALCIUM 9.1 9.2 8.5 8.8   Liver Function Tests:  Recent Labs Lab 11/12/12 2034  AST 30  ALT 13  ALKPHOS 26*  BILITOT 0.2*  PROT 7.5  ALBUMIN 3.4*   No results found for this basename: LIPASE, AMYLASE,  in the last 168 hours No results found for this basename: AMMONIA,  in the last 168 hours CBC:  Recent Labs Lab 11/12/12 2034 11/13/12 1035 11/14/12 0525  WBC 12.0* 10.0 8.9  NEUTROABS 9.2* 6.6  --   HGB 12.7 12.5 11.2*  HCT 38.7 39.0 34.8*  MCV 79.0 78.3 77.9*  PLT 351 284 245   Cardiac Enzymes:  Recent Labs Lab 11/13/12 1035  TROPONINI <0.30   BNP: No components found with this basename: POCBNP,  CBG:  Recent Labs Lab 11/14/12 1110 11/14/12 1604 11/14/12 2057 11/15/12 0602 11/15/12 1110  GLUCAP 143* 169* 137* 153* 167*    Recent Results (from the past 240 hour(s))  URINE CULTURE     Status: None   Collection Time    11/13/12 12:31 PM      Result Value Range Status   Specimen Description URINE, RANDOM   Final   Special Requests NONE   Final   Culture  Setup Time 11/13/2012 17:19   Final   Colony Count NO GROWTH   Final   Culture NO GROWTH   Final   Report Status 11/14/2012 FINAL   Final     Scheduled Meds: . amLODipine  10 mg Oral Daily  . aspirin EC  325 mg Oral Daily  . atorvastatin  20 mg Oral q1800  . cloNIDine  0.1 mg Oral BID  . donepezil  10 mg Oral QHS   . enoxaparin (LOVENOX) injection  40 mg Subcutaneous Q24H  . fenofibrate  160 mg Oral Daily  . ferrous sulfate  325 mg Oral Q breakfast  . furosemide  40 mg Intravenous BID  . hydrALAZINE  25 mg Oral Q8H  . insulin aspart  0-9 Units Subcutaneous TID WC  . metoprolol  25 mg Oral BID  . multivitamin with minerals  1 tablet Oral Daily  . omega-3 acid ethyl esters  1 g Oral BID  . sodium chloride  3 mL Intravenous Q12H   Continuous Infusions:    Koni Kannan, DO  [facility] Pager [phone]  If 7PM-7AM, please contact night-coverage [email or web address] Password [email or web address] 11/15/2012, 2:10 PM   LOS: 2 days

## 2012-11-15 NOTE — Progress Notes (Signed)
Client has preferred to be up in chair doing the day, we have kept legs up when she is not eating meals.  Able to get up with one person assist and walker.

## 2012-11-16 DIAGNOSIS — I5031 Acute diastolic (congestive) heart failure: Principal | ICD-10-CM

## 2012-11-16 DIAGNOSIS — F0390 Unspecified dementia without behavioral disturbance: Secondary | ICD-10-CM

## 2012-11-16 DIAGNOSIS — E119 Type 2 diabetes mellitus without complications: Secondary | ICD-10-CM

## 2012-11-16 DIAGNOSIS — I10 Essential (primary) hypertension: Secondary | ICD-10-CM

## 2012-11-16 LAB — BASIC METABOLIC PANEL
CO2: 34 mEq/L — ABNORMAL HIGH (ref 19–32)
Calcium: 8.6 mg/dL (ref 8.4–10.5)
GFR calc Af Amer: 31 mL/min — ABNORMAL LOW (ref >90)
GFR calc non Af Amer: 27 mL/min — ABNORMAL LOW (ref >90)
Potassium: 3.7 mEq/L (ref 3.5–5.1)
Sodium: 140 mEq/L (ref 135–145)

## 2012-11-16 LAB — GLUCOSE, CAPILLARY
Glucose-Capillary: 163 mg/dL — ABNORMAL HIGH (ref 70–99)
Glucose-Capillary: 164 mg/dL — ABNORMAL HIGH (ref 70–99)

## 2012-11-16 MED ORDER — METOPROLOL TARTRATE 25 MG PO TABS: 25.0000 mg | ORAL_TABLET | Freq: Two times a day (BID) | ORAL | Status: DC

## 2012-11-16 MED ORDER — HYDRALAZINE HCL 25 MG PO TABS: 25.0000 mg | ORAL_TABLET | Freq: Two times a day (BID) | ORAL | Status: DC

## 2012-11-16 MED ORDER — CLONIDINE HCL 0.1 MG PO TABS
0.1000 mg | ORAL_TABLET | Freq: Every day | ORAL | Status: DC
  Administered 2012-11-16: 0.1 mg via ORAL
  Filled 2012-11-16: qty 1

## 2012-11-16 MED ORDER — FUROSEMIDE 40 MG PO TABS: 40.0000 mg | ORAL_TABLET | Freq: Every day | ORAL | Status: DC

## 2012-11-16 MED ORDER — ISOSORBIDE MONONITRATE 15 MG HALF TABLET: 15.0000 mg | ORAL_TABLET | Freq: Every day | ORAL | Status: DC

## 2012-11-16 NOTE — Discharge Summary (Addendum)
Physician Discharge Summary  Debbie Chambers ZOX:[id number] DOB: 09-Jul-1929 DOA: 11/13/2012  PCP: Provider Not In System  Admit date: 11/13/2012 Discharge date: 11/16/2012  Recommendations for Outpatient Follow-up:  1. Pt will need to follow up with PCP in 1 week post discharge 2. Please obtain BMP to evaluate electrolytes and kidney function 3. Please also check CBC to evaluate Hg and Hct levels Weigh patient daily.  If you gain more than 3 pounds on successive days, then call you primary care physician. Check blood sugars three times daily and write them down.  Take results to your primary care doctor for any adjustment to your insulin dosing Discharge Diagnoses:  Principal Problem:   CHF (congestive heart failure) Active Problems:   Diabetes mellitus   HTN (hypertension)   Hyperlipidemia   Dementia   Acute diastolic CHF (congestive heart failure) Acute diastolic CHF  -Echo 11/14/2012 showed EF 60-65%, no wall motion abnormality -neg 4540cc for admission -Furosemide was initially started at 40 mg daily IV and was increased to twice a day dosing with good effect -The patient will be discharged home with furosemide 40 mg po  daily -Patient will need to have a BMP done in the next 7 days -The patient's lower extremity edema and breathing continued to improve -The patient's daughter was given instructions to wear the patient on daily basis. The patient gained more than 3 pounds on successive days, her primary care provider should be called Diabetes mellitus type 2  -Hemoglobin A1c 6.1  -Patient's CBGs have remained controlled throughout the hospitalization -She did not require nearly the demand that she was receiving at home which was 60 units of NovoLog before meals -The patient is to stop her current NovoLog dosing and monitor her sugars 3 times daily and keep a log -She'll need to followup with her primary care physician with her glycemic log for adjustment of her insulin regimen -She  will continue on aspirin 81mg  daily Hypertension  -Increase metoprolol tartrate 25 mg twice a day  -The patient's clonidine was weaned off--she will not be taking this at home -Continue hydralazine 25 mg twice a day -Imdur 15 mg daily was added -BP controlled with above changes Dementia  -Continue Aricept  Leukocytosis  -Resolved  -Urinalysis does not suggest UTI  Microcytic anemia  -Iron saturation 18%, ferritin 120  Suspect chronic renal failure -last creatinine in [facility] was 0.85 in Aug 2008 -admission Creatinine 1.72 -likely new baseline creatinine and chronic renal failure stage III to IV Family Communication: Updated daughter     Discharge Condition: stable  Disposition: home  Diet:heart healthy Wt Readings from Last 3 Encounters:  11/16/12 106.142 kg (234 lb)    History of present illness:  79 y.o. female with known history of dementia, diabetes mellitus type 2, hypertension and hyperlipidemia was noticed to have increased lower extremity edema over the last one week the patient's daughter. Patient also was noticed to have shortness of breath on minimal exertion. Due to the concerns of her recent changes patient was brought to the ER. Chest x-ray was showing congestion and BNP was elevated--1362. Patient has been admitted for CHF decompensation. As per patient's daughter she was not diagnosed with CHF previously. Patient does not complain of any chest pain does not have any nausea vomiting abdominal pain diarrhea or any fever chills. Patient is demented and does not contribute much to the history. She usually walks with a walker but over the last one week prior to admission she was not able  to that. The patient was started on intravenous furosemide 40 mg daily. This was subsequently increased to twice a day dosing. The patient had good results with good diuresis. Physical therapy was consulted. The patient did well without any desaturations. No PT followup was  recommended.     Discharge Exam: Filed Vitals:   11/16/12 1408  BP: 129/60  Pulse: 70  Temp: 98 F (36.7 C)  Resp: 18   Filed Vitals:   11/15/12 1950 11/16/12 0542 11/16/12 0951 11/16/12 1408  BP: 148/54 128/43 151/62 129/60  Pulse: 68 67  70  Temp: 97.6 F (36.4 C) 98.4 F (36.9 C)  98 F (36.7 C)  TempSrc: Oral Oral  Oral  Resp: 18 18  18   Height:      Weight:  106.142 kg (234 lb)    SpO2: 98% 97%  97%   General: Alert, but pleasantly confused, NAD, pleasant, cooperative Cardiovascular: RRR, no rub, no gallop, no S3 Respiratory: CTAB, no wheeze, no rhonchi Abdomen:soft, nontender, nondistended, positive bowel sounds Extremities: 1+LE edema, No lymphangitis, no petechiae  Discharge Instructions  Discharge Orders   Future Orders Complete By Expires     Diet - low sodium heart healthy  As directed     Discharge instructions  As directed     Comments:      Weigh patient daily.  If you gain more than 3 pounds on successive days, then call you primary care physician. Check blood sugars three times daily and write them down.  Take results to your primary care doctor for any adjustment to your insulin dosing    Increase activity slowly  As directed         Medication List    STOP taking these medications       cloNIDine 0.2 MG tablet  Commonly known as:  CATAPRES     hydrochlorothiazide 25 MG tablet  Commonly known as:  HYDRODIURIL     insulin aspart 100 UNIT/ML injection  Commonly known as:  novoLOG     lisinopril 40 MG tablet  Commonly known as:  PRINIVIL,ZESTRIL      TAKE these medications       amLODipine 10 MG tablet  Commonly known as:  NORVASC  Take 10 mg by mouth daily.     aspirin EC 81 MG tablet  Take 81 mg by mouth daily.     donepezil 10 MG tablet  Commonly known as:  ARICEPT  Take 10 mg by mouth at bedtime.     fenofibrate 160 MG tablet  Take 160 mg by mouth daily.     ferrous sulfate 325 (65 FE) MG tablet  Take 325 mg by mouth  daily with breakfast.     furosemide 40 MG tablet  Commonly known as:  LASIX  Take 1 tablet (40 mg total) by mouth daily.  Start taking on:  11/17/2012     hydrALAZINE 25 MG tablet  Commonly known as:  APRESOLINE  Take 1 tablet (25 mg total) by mouth every 12 (twelve) hours.     isosorbide mononitrate 15 mg Tb24  Commonly known as:  IMDUR  Take 0.5 tablets (15 mg total) by mouth daily.  Start taking on:  11/17/2012     metoprolol tartrate 25 MG tablet  Commonly known as:  LOPRESSOR  Take 1 tablet (25 mg total) by mouth 2 (two) times daily.     multivitamin with minerals Tabs  Take 1 tablet by mouth daily.     omega-3  acid ethyl esters 1 G capsule  Commonly known as:  LOVAZA  Take 1 g by mouth 2 (two) times daily.     pravastatin 80 MG tablet  Commonly known as:  PRAVACHOL  Take 80 mg by mouth at bedtime.     VITAMIN D PO  Take 1 tablet by mouth daily.         The results of significant diagnostics from this hospitalization (including imaging, microbiology, ancillary and laboratory) are listed below for reference.    Significant Diagnostic Studies: Dg Chest 2 View  11/13/2012   *RADIOLOGY REPORT*  Clinical Data: Shortness of breath.  CHEST - 2 VIEW  Comparison: 11/05/2008  Findings: Mild cardiomegaly and vascular congestion.  No overt edema.  No focal opacities or effusions.  No acute bony abnormality.  IMPRESSION: Cardiomegaly, vascular congestion.   Original Report Authenticated By: Charlett Nose, M.D.     Microbiology: Recent Results (from the past 240 hour(s))  URINE CULTURE     Status: None   Collection Time    11/13/12 12:31 PM      Result Value Range Status   Specimen Description URINE, RANDOM   Final   Special Requests NONE   Final   Culture  Setup Time 11/13/2012 17:19   Final   Colony Count NO GROWTH   Final   Culture NO GROWTH   Final   Report Status 11/14/2012 FINAL   Final     Labs: Basic Metabolic Panel:  Recent Labs Lab 11/12/12 2034  11/13/12 1035 11/14/12 0525 11/15/12 0635 11/16/12 0615  NA 140 142 139 140 140  K 3.9 3.9 3.8 3.6 3.7  CL 99 100 99 99 98  CO2 29 27 32 31 34*  GLUCOSE 71 87 143* 140* 128*  BUN 46* 43* 45* 44* 43*  CREATININE 1.72* 1.73* 1.68* 1.60* 1.71*  CALCIUM 9.1 9.2 8.5 8.8 8.6   Liver Function Tests:  Recent Labs Lab 11/12/12 2034  AST 30  ALT 13  ALKPHOS 26*  BILITOT 0.2*  PROT 7.5  ALBUMIN 3.4*   No results found for this basename: LIPASE, AMYLASE,  in the last 168 hours No results found for this basename: AMMONIA,  in the last 168 hours CBC:  Recent Labs Lab 11/12/12 2034 11/13/12 1035 11/14/12 0525  WBC 12.0* 10.0 8.9  NEUTROABS 9.2* 6.6  --   HGB 12.7 12.5 11.2*  HCT 38.7 39.0 34.8*  MCV 79.0 78.3 77.9*  PLT 351 284 245   Cardiac Enzymes:  Recent Labs Lab 11/13/12 1035  TROPONINI <0.30   BNP: No components found with this basename: POCBNP,  CBG:  Recent Labs Lab 11/15/12 1615 11/15/12 2058 11/16/12 0623 11/16/12 1105 11/16/12 1621  GLUCAP 190* 169* 128* 163* 164*    Time coordinating discharge:  Greater than 30 minutes  Signed:  Jhostin Epps, DO [facility] Pager: [phone] 11/16/2012, 5:43 PM

## 2012-11-16 NOTE — Clinical Documentation Improvement (Signed)
THIS DOCUMENT IS NOT A PERMANENT PART OF THE MEDICAL RECORD  Please update your documentation with the medical record to reflect your response to this query.  11/16/12   Dear Dr.Labrandon [facility]no[facility],  In a better effort to capture your patient's severity of illness, reflect appropriate length of stay and utilization of resources, a review of the patient medical record has revealed: Renal Failure documented in H&P but type and stage not noted.     Patient with history of: acute diastolic chf, hypertension and diabetes  Creatinine range since admit: 1.60 -> 1.73  White female. GFR in 2008 >60; during hospitalization ranging from 27 - 29  Please clarify type and stage of Renal Failure in progress note and discharge summary!   In responding to this query please exercise your independent judgment.  The fact that a query is asked, does not imply that any particular answer is desired or expected.    You may use possible, probable, or suspect with inpatient documentation. possible, probable, suspected diagnoses MUST be documented at the time of discharge  Reviewed: additional documentation in the medical record   Thank Lucilla Edin  Clinical Documentation Specialist: [phone] Health Information Management [place]

## 2012-12-10 ENCOUNTER — Encounter: Payer: Self-pay | Admitting: Internal Medicine

## 2012-12-10 ENCOUNTER — Ambulatory Visit (INDEPENDENT_AMBULATORY_CARE_PROVIDER_SITE_OTHER): Payer: Medicare Other | Admitting: Internal Medicine

## 2012-12-10 VITALS — BP 128/80 | HR 80 | Ht 62.0 in | Wt 241.0 lb

## 2012-12-10 DIAGNOSIS — E1165 Type 2 diabetes mellitus with hyperglycemia: Secondary | ICD-10-CM

## 2012-12-10 DIAGNOSIS — IMO0002 Reserved for concepts with insufficient information to code with codable children: Secondary | ICD-10-CM

## 2012-12-10 DIAGNOSIS — IMO0001 Reserved for inherently not codable concepts without codable children: Secondary | ICD-10-CM

## 2012-12-10 MED ORDER — INSULIN GLARGINE 100 UNIT/ML SOLOSTAR PEN: 20.0000 [IU] | PEN_INJECTOR | Freq: Every day | SUBCUTANEOUS | Status: DC

## 2012-12-10 NOTE — Progress Notes (Addendum)
Patient ID: Debbie Chambers, female   DOB: Nov 04, 1929, 79 y.o.   MRN: [id number]  HPI: Debbie Chambers is a 79 y.o.-year-old female, referred by the hospitalist team, for management of DM2, insulin-dependent, uncontrolled, with complications (CHF, proteinuria). She is here with her daughter, which is her primary caregiver, and also with her other caregiver, who comes to her house daily, while her daughter is at work. The entire history is given by her daughter and her caregiver, as the patient is nonverbal.  Patient has been diagnosed with diabetes in ~1994; she has not been on insulin before. Last hemoglobin A1c is unknown, but he was recently drawn (approximately 3 weeks ago) by the nurse practitioner who comes to her home.  Patient was recently hospitalized, before the Fourth of July, for 5 days, for exacerbation of congestive heart failure (new dx). Of note, her Epic chart contains an error, I was not able to review any recent documentation of labs after 2008. They tell me that her insulin was held in the hospital, however, she did not even triggered a sliding scale. She was discharged without insulin, however, her primary care physician advised her to restart the previous doses of insulin. Of note, she was off insulin at home for about a week, with sugars that were mostly at goal, slightly higher in a.m. than later in the day.   Pt is on a regimen of: - Novolog 60 units bid ac - pen She injects in the abdomen, rotating sites. No bruises or nodules. Due to the insulin use out of the fridge  Pt checks her sugars 3 a day (prior to her recent hospitalization, she was taking twice a day) and they are: - am:  96-152 ( highest 187) - before lunch: 108-158 - Before dinner: 82-169 Few lows. Lowest sugar was 55 x1 in 3 months, but she also had a 60- it is unclear whether she has hypoglycemia awareness as she has dementia, and cannot communicate very well. Highest sugar was 187, even when she was off insulin  completely.  Pt's meals are: - Breakfast: eggs + pancake + sugar free syrup +  milk 8 ounces - Lunch: cheese on toast + glass of Crystal light lemonade; tuna fish sandwich - Dinner: baked chicken + frozen vegetables + yogurt - Snacks: 3 a day, yogurt, Nutri-Grain bar, crackers + peanut butter  Pt has chronic kidney disease stage 3 per latest note from PCP. Daughter signed a release of information consent form >> he received that result after the visit (please see end of note) Pt's last eye exam was !5 years ago. No DR. Denies numbness and tingling in her legs.  I reviewed her chart and she also has a history of hypertension, hyperlipidemia, dementia, history of A. Fib and proteinuria per documentation in 05/2005.  Pt has FH of DM in sister, granddaughter.  ROS: Constitutional: no weight gain/loss, no fatigue, no subjective hyperthermia/hypothermia Eyes: no blurry vision, no xerophthalmia ENT: no sore throat, no nodules palpated in throat, no dysphagia/odynophagia, no hoarseness Cardiovascular: no CP/+ SOB/n palpitations/+ leg swelling Respiratory: no cough/+ SOB Gastrointestinal: no N/V/D/C Musculoskeletal: no muscle/joint aches Skin: no rashes Neurological: no tremors/numbness/tingling/dizziness Psychiatric: no depression/anxiety  Past Medical History  Diagnosis Date  . Dementia   . Hypertension   . Diabetes mellitus without complication   . Hypercholesterolemia   also, the records from PCP: - Congestive heart failure - Vitamin D deficiency - Iron deficiency anemia - Poor circulation in extremity - hearing loss  Past Surgical History  Procedure Laterality Date  . Hand surgery    . Abdominal hysterectomy     History   Social History  . Marital Status: Widowed    Spouse Name: N/A    Number of Children: 5   Occupational History  . retired   Social History Main Topics  . Smoking status: Never Smoker   . Smokeless tobacco: Never Used  . Alcohol Use: No  . Drug  Use: No  Walks for exercise.  Current Outpatient Prescriptions on File Prior to Visit  Medication Sig Dispense Refill  . [DISCONTINUED] metoprolol (LOPRESSOR) 50 MG tablet Take 25 mg by mouth daily.       No current facility-administered medications on file prior to visit.   Allergies  Allergen Reactions  . Penicillins    Family History  Problem Relation Age of Onset  . CAD Father    PE: BP 128/80  Pulse 80  Ht 5\' 2"  (1.575 m)  Wt 241 lb (109.317 kg)  BMI 44.07 kg/m2 Wt Readings from Last 3 Encounters:  12/10/12 241 lb (109.317 kg)   Constitutional: obese, in NAD, nonverbal Eyes: PERRLA, EOMI, no exophthalmos ENT: moist mucous membranes, no thyromegaly, no cervical lymphadenopathy Cardiovascular: RRR, No MRG, +3 pitting edema bilaterally Respiratory: CTA B Gastrointestinal: abdomen soft, NT, ND, BS+ Musculoskeletal: no deformities, strength intact in all 4 Skin: moist, warm, no rashes Neurological: no tremor with outstretched hands, DTR normal in all 4  ASSESSMENT: 1. DM2, insulin-dependent, uncontrolled, with complications - CHF - Proteinuria  PLAN:  1. Patient with long-standing diabetes, with very good control per reviewed detailed sugar log that she brings with her. She has a controlled diet, which is set for her by her main caregivers. She gets her insulin herself, despite diagnosis of dementia.  The fact that she did not sugar sliding scale insulin in the hospital (I cannot review these records, but the fact that she was told to hold insulin at discharge would confirm) and the fact that she had sugars almost at target when not taking her 120 units of rapid acting insulin at home, makes me think that she, indeed, does not need that much of acting insulin. In fact, I discussed with the daughter and the caregiver to switch to Lantus insulin, low dose, 20 units at night, and then reevaluate in 3 weeks to see if she needs addition of short-acting insulin. They agree with  the plan. - We'll try to obtain the records of her latest hemoglobin A1c and the rest of her labs, including kidney function. If we cannot obtain these, will check them at next visit - I sent a prescription for Lantus to her pharmacy - Strongly advised her to schedule a new appointment with an ophthalmologist for her annual eye exam - advised to continue to check sugars 2-3 times a day and bring the log at next appointment - given foot care handout and explained the principles - given instructions for hypoglycemia management "15-15 rule" - will check a foot exam and next visit  12/17/2012:  Received labs from patient's PCP, performed on 11/21/2012: - hemoglobin A1c 6.1% - BMP normal, except CO2 34 (19-32), glucose 178, BUN/Cr 38/1.53, GFR 32

## 2012-12-10 NOTE — Patient Instructions (Signed)
Please stop Novolog and start Lantus 20 units every night before bedtime. Please check with your pharmacy if Levemir is not the long-acting insulin is the preferred one by your insurance. In that case, let me know and we will change. Please return in 3-4 weeks with your sugar log. Continue to check sugars 2-3x a day.

## 2013-01-04 ENCOUNTER — Other Ambulatory Visit: Payer: Self-pay | Admitting: *Deleted

## 2013-01-04 DIAGNOSIS — E1165 Type 2 diabetes mellitus with hyperglycemia: Secondary | ICD-10-CM

## 2013-01-04 MED ORDER — INSULIN GLARGINE 100 UNIT/ML SOLOSTAR PEN: 20.0000 [IU] | PEN_INJECTOR | Freq: Every day | SUBCUTANEOUS | Status: DC

## 2013-01-04 NOTE — Telephone Encounter (Signed)
Pt's pharmacy sent a fax requesting a new rx for lantus to be sent to them. Sending rx electronically.

## 2013-01-18 ENCOUNTER — Encounter: Payer: Self-pay | Admitting: Internal Medicine

## 2013-01-18 ENCOUNTER — Ambulatory Visit (INDEPENDENT_AMBULATORY_CARE_PROVIDER_SITE_OTHER): Payer: Medicare Other | Admitting: Internal Medicine

## 2013-01-18 VITALS — BP 130/70 | HR 78 | Ht 59.0 in | Wt 243.0 lb

## 2013-01-18 DIAGNOSIS — E1165 Type 2 diabetes mellitus with hyperglycemia: Secondary | ICD-10-CM

## 2013-01-18 MED ORDER — INSULIN GLARGINE 100 UNIT/ML SOLOSTAR PEN: 25.0000 [IU] | PEN_INJECTOR | Freq: Every day | SUBCUTANEOUS | Status: DC

## 2013-01-18 NOTE — Progress Notes (Signed)
Patient ID: Debbie Chambers, female   DOB: 1929-07-10, 79 y.o.   MRN: [id number]  HPI: Debbie Chambers is a 79 y.o.-year-old female, initially referred by the hospitalist team, for management of DM2, dx 1994., insulin-dependent, uncontrolled, with complications (CHF, proteinuria). She returns for followup for her diabetes. She is here with her daughter, which is her primary caregiver, and also with her other caregiver, who comes to her house daily, while her daughter is at work. The entire history is given by her daughter and her caregiver, as the patient is nonverbal. Last visit 5 weeks ago.   Last hemoglobin A1c was 6.1% per records recently received from PCP - 11/21/2012. Previously, in 05/2012, hemoglobin A1c was 6.4%.  Patient hospitalized in 11/2012, for 5 days, for exacerbation of congestive heart failure (new dx). Of note, her Epic chart contains an error, I was not able to review any recent documentation of labs after 2008. Her insulin was held in the hospital, however, she did not even triggered a sliding scale. She was discharged without insulin, however, her primary care physician advised her to restart the previous doses of insulin. Of note, she was off insulin at home for about a week, with sugars that were mostly at goal, slightly higher in a.m. than later in the day.   At last visit, she was on a regimen of: - Novolog 60 units bid ac - pen She injects in the abdomen, rotating sites. No bruises or nodules. Due to the insulin use out of the fridge  At this visit: Due to great sugars, we d/c'd Novolog and switched to Lantus 20 units at night, in light of CBG reviewed from the hospital visit.  Pt checks her sugars 3 a day (prior to her recent hospitalization, she was taking twice a day) and they are: - am:  96-152 ( highest 187) >> 87-190 - before lunch: 108-158 >> 135-180 - Before dinner: 82-169 >>130-180 Few lows. Lowest sugar was 87 ( had one instance of 46, meter broken) x1 in last month  - it is unclear whether she has hypoglycemia awareness as she has dementia, and cannot communicate very well. However, the caregiver is coming back she felt much better in the last month.   Pt has chronic kidney disease stage 3, latest BUN/creatinine was 38/1.53 on 11/21/2012. Pt's last eye exam was at the end of 11/2012 >> no DR.  Denies numbness and tingling in her legs.  Pt has a history of hypertension, hyperlipidemia, dementia, history of A. Fib and proteinuria per documentation in 05/2005. Also, per records received from PCP: - Congestive heart failure - Vitamin D deficiency - Iron deficiency anemia - Poor circulation in extremity - hearing loss  ROS: Constitutional: no weight gain/loss, no fatigue, no subjective hyperthermia/hypothermia Eyes: no blurry vision, no xerophthalmia ENT: no sore throat, no nodules palpated in throat, no dysphagia/odynophagia, no hoarseness Cardiovascular: no CP/SOB/n palpitations/+ leg swelling Respiratory: no cough/SOB Gastrointestinal: no N/V/D/C Musculoskeletal: no muscle/joint aches Skin: no rashes Neurological: no tremors/numbness/tingling/dizziness  I reviewed pt's medications, allergies, PMH, social hx, family hx and no changes required, except as mentioned above.  PE: BP 130/70  Pulse 78  Ht 4\' 11"  (1.499 m)  Wt 243 lb (110.224 kg)  BMI 49.05 kg/m2  SpO2 92% Wt Readings from Last 3 Encounters:  01/18/13 243 lb (110.224 kg)  12/10/12 241 lb (109.317 kg)   Constitutional: obese, in NAD, nonverbal Eyes: PERRLA, EOMI, no exophthalmos ENT: moist mucous membranes, no thyromegaly, no cervical lymphadenopathy  Cardiovascular: RRR, No MRG, +3 pitting edema bilaterally Respiratory: CTA B Gastrointestinal: abdomen soft, NT, ND, BS+ Musculoskeletal: no deformities, strength intact in all 4 Skin: moist, warm, no rashes  ASSESSMENT: 1. DM2, insulin-dependent, uncontrolled, with complications - CHF - Proteinuria 11/21/2012: - hemoglobin  A1c 6.1% - BMP normal, except CO2 34 (19-32), glucose 178, BUN/Cr 38/1.53, GFR 32  PLAN:  1. DM2  Patient with long-standing diabetes, with very good control per reviewed detailed sugar log that she brings with her. She has a controlled diet, which is set for her by her main caregivers.  The fact that she did not triger sliding scale insulin in the hospital and the fact that she had sugars almost at target when not taking her 120 units of rapid acting insulin at home, made me think that she, indeed, does not need that much of acting insulin and at last visit we switched to Lantus insulin, 20 units at night. She did very well on this regimen, and she does not have lows anymore. She has one value of 43, however, her meter was broken at that time. Other than that, she had one other value at 87, the rest of the sugars were in the 100s. She very seldom has sugars in the 180s-200s. - However, since sugars in the morning are higher, mostly in the 160s-170s, I advised him to increase Lantus to 25 units at night - they agreed to call me if sugars are consistently <80, and >250 - I again discussed with the caregivers that avoiding lows would be much more important for her than an occasional high, especially since her hemoglobin A1c was 6.1%. - continue to check sugars 2 times a day and bring the log at next appointment - will check a foot exam and next visit - hemoglobin A1c will be checked at next visit  2.Leg swelling - ? Amlodipine effect

## 2013-01-18 NOTE — Patient Instructions (Signed)
Please increase Lantus to 25 units every night. Please return in 3 months but let me know if you have lows <80 or highs >250s. No labs for today.

## 2013-04-26 ENCOUNTER — Encounter: Payer: Self-pay | Admitting: Internal Medicine

## 2013-04-26 ENCOUNTER — Ambulatory Visit (INDEPENDENT_AMBULATORY_CARE_PROVIDER_SITE_OTHER): Payer: Medicare Other | Admitting: Internal Medicine

## 2013-04-26 VITALS — BP 128/60 | HR 117 | Temp 97.9°F | Resp 10 | Wt 237.4 lb

## 2013-04-26 DIAGNOSIS — E1129 Type 2 diabetes mellitus with other diabetic kidney complication: Secondary | ICD-10-CM

## 2013-04-26 DIAGNOSIS — N058 Unspecified nephritic syndrome with other morphologic changes: Secondary | ICD-10-CM

## 2013-04-26 MED ORDER — INSULIN ASPART 100 UNIT/ML FLEXPEN: PEN_INJECTOR | SUBCUTANEOUS | Status: DC

## 2013-04-26 MED ORDER — INSULIN PEN NEEDLE 31G X 5 MM MISC: Status: DC

## 2013-04-26 NOTE — Progress Notes (Signed)
Patient ID: Debbie Chambers, female   DOB: May 23, 1930, 79 y.o.   MRN: [id number]  HPI: Debbie Chambers is a 79 y.o.-year-old female, initially referred by the hospitalist team, for management of DM2, dx 1994., insulin-dependent, uncontrolled, with complications (CHF, proteinuria). She returns for followup for her diabetes. She is here with her daughter, which is her primary caregiver, The entire history is given by her daughter and her caregiver, as the patient is nonverbal. Last visit 77 weeks ago.   Last hemoglobin A1c was 6.1% per records recently received from PCP - 11/21/2012. Previously, in 05/2012, hemoglobin A1c was 6.4%.  Reviewed pertinent hx: Patient hospitalized in 11/2012, for 5 days, for exacerbation of congestive heart failure (new dx). Of note, her Epic chart contains an error, I was not able to review any recent documentation of labs after 2008. Her insulin was held in the hospital, however, she did not even triggered a sliding scale. She was discharged without insulin, however, her primary care physician advised her to restart the previous doses of insulin. Of note, she was off insulin at home for about a week, with sugars that were mostly at goal, slightly higher in a.m. than later in the day.   She was previously on: - Novolog 60 units bid ac - pen  Currently: - Lantus 20 units in a.m. - no NovoLog   Pt checks her sugars 2a day (prior to her recent hospitalization, she was taking twice a day) and they are: - am:  96-152 ( highest 187) >> 87-190 >> 124-200 (63, 283) - before lunch: 108-158 >> 135-180 >> n/c - Before dinner: 82-169 >>130-180 >> 136-286 (highest 307) Few lows. Lowest sugar was 63 x1 in last month - it is unclear whether she has hypoglycemia awareness as she has dementia, and cannot communicate very well.   Meals - per daughter's description: - Breakfast: waffle + scramble eggs - Lunch: sandwich with wheat bread - Dinner: sweat potatoes + cinnamon + splenda -  Snacks: greek yoghurt, breakfast bar  Pt has chronic kidney disease stage 3, latest BUN/creatinine was 38/1.53 on 11/21/2012. Pt's last eye exam was at the end of 11/2012 >> no DR.  Denies numbness and tingling in her legs.  Pt has a history of hypertension, hyperlipidemia, dementia, history of A. Fib and proteinuria per documentation in 05/2005. Also, - Congestive heart failure - Vitamin D deficiency - Iron deficiency anemia - Poor circulation in extremity - hearing loss  ROS: Constitutional: no weight gain/loss, no fatigue, no subjective hyperthermia/hypothermia Eyes: no blurry vision, no xerophthalmia ENT: no sore throat, no nodules palpated in throat, no dysphagia/odynophagia, no hoarseness Cardiovascular: no CP/SOB/n palpitations/+ leg swelling Respiratory: no cough/SOB Gastrointestinal: no N/V/D/C Musculoskeletal: no muscle/joint aches Skin: no rashes Neurological: no tremors/numbness/tingling/dizziness  I reviewed pt's medications, allergies, PMH, social hx, family hx and no changes required, except as mentioned above.  PE: BP 128/60  Pulse 117  Temp(Src) 97.9 F (36.6 C) (Oral)  Resp 10  Wt 237 lb 6.4 oz (107.684 kg)  SpO2 95% Wt Readings from Last 3 Encounters:  04/26/13 237 lb 6.4 oz (107.684 kg)  01/18/13 243 lb (110.224 kg)  12/10/12 241 lb (109.317 kg)   Constitutional: obese, in NAD, nonverbal Eyes: PERRLA, EOMI, no exophthalmos ENT: moist mucous membranes, no thyromegaly, no cervical lymphadenopathy Cardiovascular: RRR, No MRG, No LE edema  Respiratory: CTA B Gastrointestinal: abdomen soft, NT, ND, BS+ Musculoskeletal: no deformities, strength intact in all 4 Skin: moist, warm, no rashes  ASSESSMENT:  1. DM2, insulin-dependent, uncontrolled, with complications - CHF - Proteinuria 11/21/2012: - hemoglobin A1c 6.1% - BMP normal, except CO2 34 (19-32), glucose 178, BUN/Cr 38/1.53, GFR 32  PLAN:  1. DM2  Patient with long-standing diabetes, with  worsened control per reviewed sugar log that she brings with her. She checks her sugars twice a day, before breakfast and before dinner. I do not have her sugars checked around lunchtime or at bedtime. I advised the patient and her daughter to start rotating the checks, so that we have few sugars in between the other checks. - I am not sure why her diabetes worsened, her daughter, this might be because of worsening diet - I advised them to: - start NovoLog 5 units with lunch, I'm not sure if needs it for dinner, too, but I would not want her to become hypoglycemic overnight - we can add this at next visit, if needed - continue Lantus to 20 units in a.m. - I again discussed with the caregivers that avoiding lows would be much more important for her than an occasional high, especially since her hemoglobin A1c was 6.1%. - continue to check sugars 2 times a day and bring the log at next appointment - will check a foot exam and next visit - hemoglobin A1c was apparently checked by her caregiver nurse and they will bring me the records - RTC in 1 month  2.Leg swelling - resolved

## 2013-04-26 NOTE — Patient Instructions (Signed)
Continue Lantus 20 units in am. Start NovoLog 5 units 15 min before lunch. Please return in 1 month with your sugar log.

## 2013-05-27 ENCOUNTER — Ambulatory Visit: Payer: Medicare Other | Admitting: Internal Medicine

## 2013-06-21 ENCOUNTER — Ambulatory Visit (INDEPENDENT_AMBULATORY_CARE_PROVIDER_SITE_OTHER): Payer: Medicare Other | Admitting: Internal Medicine

## 2013-06-21 ENCOUNTER — Encounter: Payer: Self-pay | Admitting: Internal Medicine

## 2013-06-21 VITALS — BP 136/68 | HR 84 | Temp 97.9°F | Resp 16 | Wt 239.8 lb

## 2013-06-21 DIAGNOSIS — E1129 Type 2 diabetes mellitus with other diabetic kidney complication: Secondary | ICD-10-CM

## 2013-06-21 MED ORDER — INSULIN ASPART 100 UNIT/ML FLEXPEN: PEN_INJECTOR | SUBCUTANEOUS | Status: DC

## 2013-06-21 NOTE — Patient Instructions (Addendum)
Please continue Lantus 20 units in am. Increase Novolog to: 10 units with every meal - inject 15 min before the meal. Please return in 1 month with your sugar log.

## 2013-06-21 NOTE — Progress Notes (Signed)
Patient ID: Debbie Chambers, female   DOB: 10/01/1929, 80 y.o.   MRN: [id number]  HPI: Debbie Chambers is a 80 y.o.-year-old female, returning for f/u for DM2, dx 1994, insulin-dependent, uncontrolled, with complications (CHF, proteinuria). She is here with her daughter, which is her primary caregiver, the entire history is given by her daughter as the patient is nonverbal. Last visit ~2 mo ago.   Last hemoglobin A1c was  - 9.1% on 05/10/2013 - 6.1% on 11/21/2012 - 6.4% in 05/2012  Reviewed pertinent hx: Patient hospitalized in 11/2012, for 5 days, for exacerbation of congestive heart failure (new dx). Of note, her Epic chart contains an error, I was not able to review any recent documentation of labs after 2008. Her insulin was held in the hospital, however, she did not even triggered a sliding scale. She was discharged without insulin, however, her primary care physician advised her to restart the previous doses of insulin. Of note, she was off insulin at home for about a week, with sugars that were mostly at goal, slightly higher in a.m. than later in the day.   She was previously on: - Novolog 60 units bid ac - pen  Currently: - Lantus 20 units in a.m. - NovoLog - started 5 units with lunch at last visit  Pt checks her sugars 2x day (prior to her recent hospitalization, she was taking twice a day) and they are: - am:  96-152 ( highest 187) >> 87-190 >> 124-200 (63, 283) >> 157-275 - before lunch: 108-158 >> 135-180 >> n/c >> 218-230 - Before dinner: 82-169 >>130-180 >> 136-286 (highest 307) >> 148-282 Few lows. Lowest sugar was 63 x1 in last month - it is unclear whether she has hypoglycemia awareness as she has dementia, and cannot communicate very well.   Meals - per daughter's description - she drinks 2 gallons of 2% milk a week: - Breakfast: scramble eggs, wheat bread - Lunch: sandwich with wheat bread - Dinner: baked chicken or [place] burger; sweat potatoes + cinnamon + splenda -  Snacks: greek yoghurt, breakfast bar, sugar-free pudding  Pt has chronic kidney disease stage 3, latest BUN/creatinine was 41/1.39 on 05/10/2013 << 38/1.53 on 11/21/2012. Pt's last eye exam was in 11/2012 >> no DR.  Denies numbness and tingling in her legs.  Pt has a history of hypertension, hyperlipidemia, dementia, history of A. Fib and proteinuria per documentation in 05/2005. Also, - Congestive heart failure - Vitamin D deficiency - Iron deficiency anemia - Poor circulation in extremity - hearing loss  ROS: Constitutional: no weight gain/loss, no fatigue, no subjective hyperthermia/hypothermia Eyes: no blurry vision, no xerophthalmia ENT: no sore throat, no nodules palpated in throat, no dysphagia/odynophagia, no hoarseness Cardiovascular: no CP/SOB/n palpitations/+ leg swelling Respiratory: no cough/SOB Gastrointestinal: no N/V/D/C Musculoskeletal: no muscle/joint aches Skin: no rashes Neurological: no tremors/numbness/tingling/dizziness  I reviewed pt's medications, allergies, PMH, social hx, family hx and no changes required, except as mentioned above.  PE: BP 136/68  Pulse 84  Temp(Src) 97.9 F (36.6 C) (Oral)  Resp 16  Wt 239 lb 12.8 oz (108.773 kg)  SpO2 94% Wt Readings from Last 3 Encounters:  06/21/13 239 lb 12.8 oz (108.773 kg)  04/26/13 237 lb 6.4 oz (107.684 kg)  01/18/13 243 lb (110.224 kg)   Constitutional: obese, in NAD, nonverbal Eyes: PERRLA, EOMI, no exophthalmos ENT: moist mucous membranes, no thyromegaly, no cervical lymphadenopathy Cardiovascular: RRR, No MRG, No LE edema  Respiratory: CTA B Gastrointestinal: abdomen soft, NT, ND, BS+  Musculoskeletal: no deformities, strength intact in all 4 Skin: moist, warm, no rashes  ASSESSMENT: 1. DM2, insulin-dependent, uncontrolled, with complications - CHF - Proteinuria  05/15/2013: - HbA1c 9.1% - BMP normal, except glucose 318, BUN/Cr 41/1.39, GFR 36  11/21/2012: - HbA1c 6.1% - BMP normal,  except CO2 34 (19-32), glucose 178, BUN/Cr 38/1.53, GFR 32  PLAN:  1. DM2  Patient with long-standing diabetes, with worsened control per last Hba1c and recent Glu on BMP. Her sugars are around 200. - I advised them to limit milk intake or switch to Almond milk, which has no carbs - discussed healthier snacks - I also advised them to: Patient Instructions  Please continue Lantus 20 units in am. Increase Novolog to: 10 units with every meal - inject 15 min before the meal. Please return in 1 month with your sugar log.  - continue to check sugars 2 times a day and bring the log at next appointment - refilled NovoLog

## 2013-07-23 ENCOUNTER — Ambulatory Visit (INDEPENDENT_AMBULATORY_CARE_PROVIDER_SITE_OTHER): Payer: Medicare Other | Admitting: Internal Medicine

## 2013-07-23 ENCOUNTER — Encounter: Payer: Self-pay | Admitting: Internal Medicine

## 2013-07-23 VITALS — BP 118/60 | HR 73 | Temp 98.1°F | Resp 12 | Wt 234.0 lb

## 2013-07-23 DIAGNOSIS — E1129 Type 2 diabetes mellitus with other diabetic kidney complication: Secondary | ICD-10-CM

## 2013-07-23 NOTE — Progress Notes (Signed)
Patient ID: Debbie Chambers, female   DOB: 06/17/1929, 80 y.o.   MRN: [id number]  HPI: Debbie Chambers is a 80 y.o.-year-old female, returning for f/u for DM2, dx 1994, insulin-dependent, uncontrolled, with complications (CHF, proteinuria). She is here with her daughter, which is her primary caregiver, the entire history is given by her daughter as the patient is nonverbal. Last visit ~1 mo ago.   Last hemoglobin A1c was  - 9.1% on 05/10/2013 - 6.1% on 11/21/2012 - 6.4% in 05/2012  Reviewed pertinent hx: Patient hospitalized in 11/2012, for 5 days, for exacerbation of congestive heart failure (new dx). Of note, her Epic chart contains an error, I was not able to review any recent documentation of labs after 2008. Her insulin was held in the hospital, however, she did not even triggered a sliding scale. She was discharged without insulin, however, her primary care physician advised her to restart the previous doses of insulin. Of note, she was off insulin at home for about a week, with sugars that were mostly at goal, slightly higher in a.m. than later in the day.  She is on: - NovoLog - 10 units tid - She stopped Lantus 20 units in a.m. >> they did not understand they nee to continue!!!  Pt checks her sugars 3x day and they are same as before (despite increasing NovoLog since she is off Lantus now): - am:  96-152 ( highest 187) >> 87-190 >> 124-200 (63, 283) >> 157-275 >> 185-270 - before lunch: 108-158 >> 135-180 >> n/c >> 218-230 >> 151-248 - 2h after lunch: 106-261 - Before dinner: 82-169 >>130-180 >> 136-286 (highest 307) >> 148-282 >> 99-250 No lows - it is unclear whether she has hypoglycemia awareness as she has dementia, and cannot communicate very well.   Meals - per daughter's description - she stopped drinking gallons of 2% milk a day but switched to Almond milk >> lost 6 lbs since last visit! - Breakfast: scramble eggs, wheat bread - Lunch: sandwich with wheat bread - Dinner: baked  chicken or [place] burger; sweat potatoes + cinnamon + splenda - Snacks: greek yoghurt, breakfast bar, sugar-free pudding  The daughter is telling me that she is now walking so much >> will start with a new aid and hopefully start to walk more.  Pt has chronic kidney disease stage 3, latest BUN/creatinine was 41/1.39 on 05/10/2013 << 38/1.53 on 11/21/2012. Pt's last eye exam was in 11/2012 >> no DR.  Denies numbness and tingling in her legs.  Pt has a history of hypertension, hyperlipidemia, dementia, history of A. Fib and proteinuria per documentation in 05/2005. Also, - Congestive heart failure - Vitamin D deficiency - Iron deficiency anemia - Poor circulation in extremity - hearing loss  ROS: Constitutional: + weight loss, no fatigue, no subjective hyperthermia/hypothermia Eyes: no blurry vision, no xerophthalmia ENT: no sore throat, no nodules palpated in throat, no dysphagia/odynophagia, no hoarseness Cardiovascular: no CP/SOB/n palpitations/+ leg swelling Respiratory: no cough/SOB Gastrointestinal: no N/V/D/C Musculoskeletal: no muscle/joint aches Skin: no rashes Neurological: no tremors/numbness/tingling/dizziness  I reviewed pt's medications, allergies, PMH, social hx, family hx and no changes required, except as mentioned above.  PE: BP 118/60  Pulse 73  Temp(Src) 98.1 F (36.7 C) (Oral)  Resp 12  Wt 234 lb (106.142 kg)  SpO2 94% Wt Readings from Last 3 Encounters:  07/23/13 234 lb (106.142 kg)  06/21/13 239 lb 12.8 oz (108.773 kg)  04/26/13 237 lb 6.4 oz (107.684 kg)   Constitutional: obese,  in NAD, nonverbal Eyes: PERRLA, EOMI, no exophthalmos ENT: moist mucous membranes, no thyromegaly, no cervical lymphadenopathy Cardiovascular: RRR, No MRG, No LE edema  Respiratory: CTA B Gastrointestinal: abdomen soft, NT, ND, BS+ Musculoskeletal: no deformities, strength intact in all 4 Skin: moist, warm, no rashes  ASSESSMENT: 1. DM2, insulin-dependent,  uncontrolled, with complications - CHF - Proteinuria  05/15/2013: - HbA1c 9.1% - BMP normal, except glucose 318, BUN/Cr 41/1.39, GFR 36  11/21/2012: - HbA1c 6.1% - BMP normal, except CO2 34 (19-32), glucose 178, BUN/Cr 38/1.53, GFR 32  PLAN:  1. DM2  Patient with long-standing diabetes, with worsened control per last Hba1c and recent Glu on BMP. Her sugars are around 200. - I advised them to limit milk intake or switch to Almond milk, which has no carbs - discussed healthier snacks - I also advised them to: Patient Instructions  Please restart Lantus 20 units in am. After 1 week, if sugars not improved, increase to 25 units of Lantus. Keep Novolog at 10 units with every meal - inject 15 min before the meal. Please return in 1 month with your sugar log.  - continue to check sugars 3 times a day and bring the log at next appointment

## 2013-07-23 NOTE — Patient Instructions (Signed)
Please restart Lantus 20 units in am. After 1 week, if sugars not improved, increase to 25 units of Lantus. Keep Novolog at 10 units with every meal - inject 15 min before the meal. Please return in 1 month with your sugar log.

## 2013-08-23 ENCOUNTER — Ambulatory Visit: Payer: Medicare Other | Admitting: Internal Medicine

## 2013-08-27 ENCOUNTER — Ambulatory Visit: Payer: Medicare Other | Admitting: Internal Medicine

## 2013-09-03 ENCOUNTER — Other Ambulatory Visit: Payer: Self-pay | Admitting: *Deleted

## 2013-09-03 MED ORDER — INSULIN ASPART 100 UNIT/ML FLEXPEN: PEN_INJECTOR | SUBCUTANEOUS | Status: DC

## 2013-09-09 ENCOUNTER — Ambulatory Visit: Payer: Medicare Other | Admitting: Internal Medicine

## 2013-10-11 IMAGING — CR DG CHEST 2V
2 series · 2 of 2 positions shown · non-contrast
Comparison: 11/05/2008

CLINICAL DATA: Shortness of breath.

CHEST - 2 VIEW

[x chest ap]
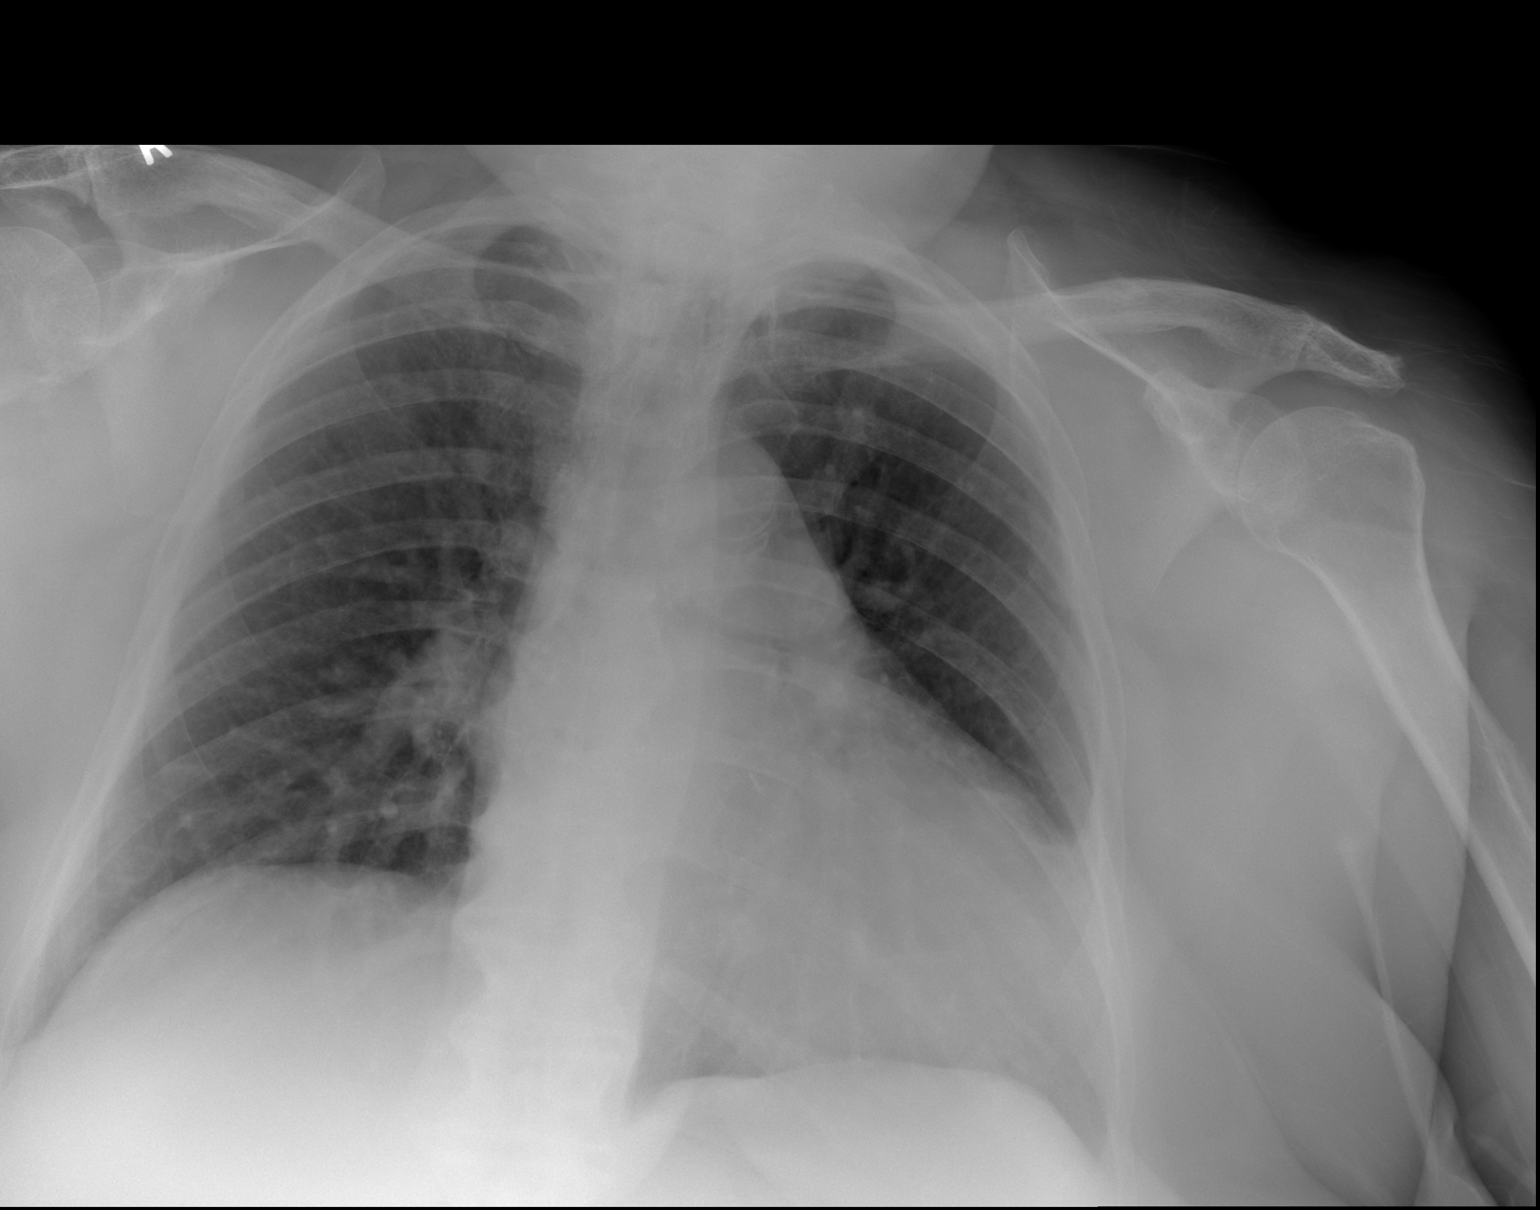

[w chest lat]
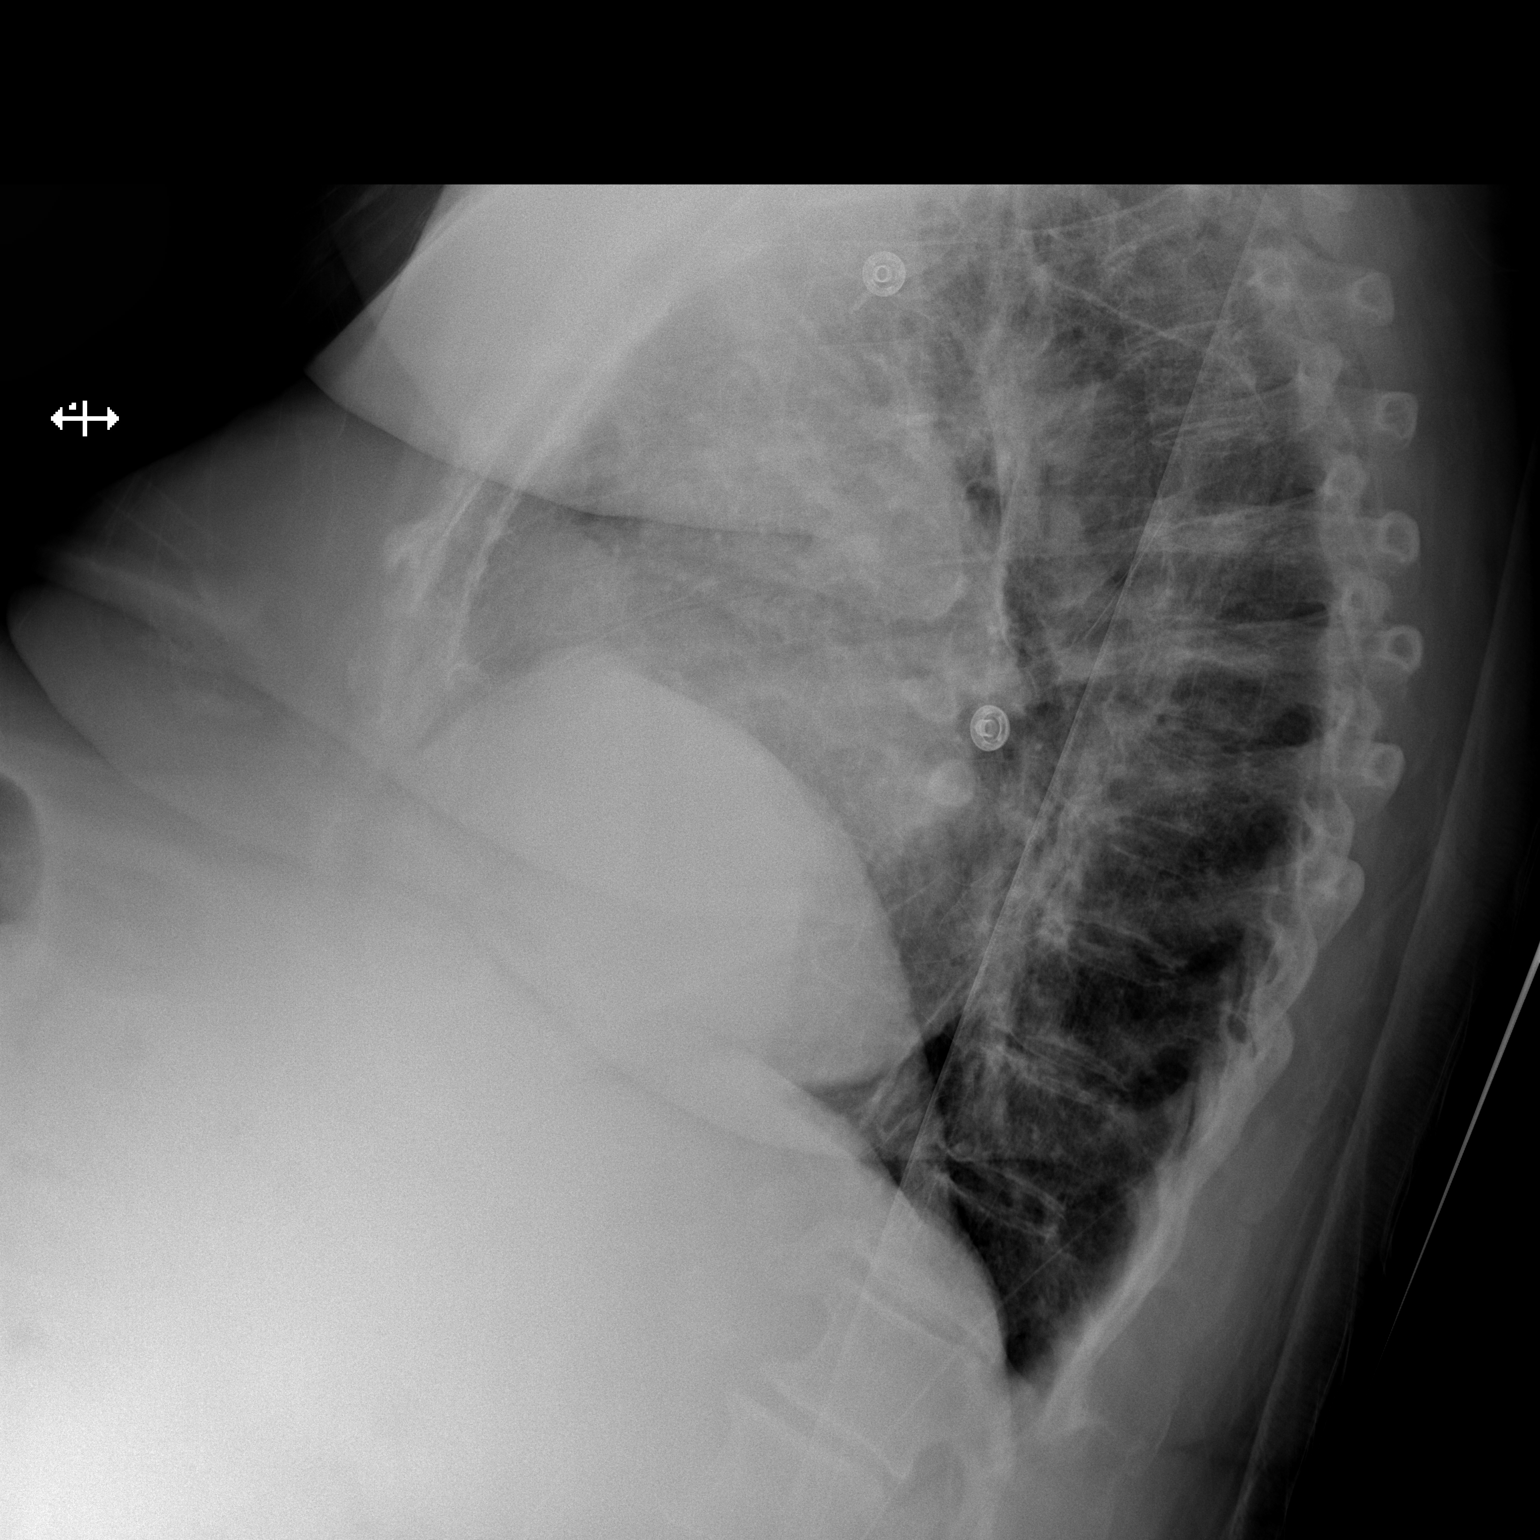

[2 of 2 positions shown; findings below may reference images not displayed]

FINDINGS: Mild cardiomegaly and vascular congestion.  No overt
edema.  No focal opacities or effusions.  No acute bony
abnormality.
IMPRESSION: Cardiomegaly, vascular congestion.

## 2014-01-06 ENCOUNTER — Other Ambulatory Visit: Payer: Self-pay

## 2014-01-06 ENCOUNTER — Other Ambulatory Visit: Payer: Self-pay | Admitting: *Deleted

## 2014-01-06 ENCOUNTER — Telehealth: Payer: Self-pay | Admitting: Internal Medicine

## 2014-01-06 DIAGNOSIS — IMO0002 Reserved for concepts with insufficient information to code with codable children: Secondary | ICD-10-CM

## 2014-01-06 DIAGNOSIS — E1165 Type 2 diabetes mellitus with hyperglycemia: Secondary | ICD-10-CM

## 2014-01-06 MED ORDER — INSULIN GLARGINE 100 UNIT/ML SOLOSTAR PEN: 25.0000 [IU] | PEN_INJECTOR | Freq: Every day | SUBCUTANEOUS | Status: DC

## 2014-01-06 NOTE — Telephone Encounter (Signed)
Pt needs refills of the lantus solostar called in please

## 2014-01-06 NOTE — Telephone Encounter (Signed)
rx sent

## 2014-01-20 ENCOUNTER — Other Ambulatory Visit: Payer: Self-pay | Admitting: *Deleted

## 2014-01-20 DIAGNOSIS — E1165 Type 2 diabetes mellitus with hyperglycemia: Secondary | ICD-10-CM

## 2014-01-20 DIAGNOSIS — IMO0002 Reserved for concepts with insufficient information to code with codable children: Secondary | ICD-10-CM

## 2014-01-20 MED ORDER — INSULIN GLARGINE 100 UNIT/ML SOLOSTAR PEN: 25.0000 [IU] | PEN_INJECTOR | Freq: Every day | SUBCUTANEOUS | Status: DC

## 2014-01-20 NOTE — Telephone Encounter (Signed)
Rx defaulted to No Print. Resending rx for lantus.

## 2014-02-13 ENCOUNTER — Ambulatory Visit (INDEPENDENT_AMBULATORY_CARE_PROVIDER_SITE_OTHER): Payer: Medicare Other | Admitting: Internal Medicine

## 2014-02-13 ENCOUNTER — Encounter: Payer: Self-pay | Admitting: Internal Medicine

## 2014-02-13 VITALS — BP 172/82 | HR 84 | Temp 97.6°F | Resp 12 | Wt 226.0 lb

## 2014-02-13 DIAGNOSIS — E1129 Type 2 diabetes mellitus with other diabetic kidney complication: Secondary | ICD-10-CM

## 2014-02-13 DIAGNOSIS — IMO0002 Reserved for concepts with insufficient information to code with codable children: Secondary | ICD-10-CM

## 2014-02-13 DIAGNOSIS — E1165 Type 2 diabetes mellitus with hyperglycemia: Secondary | ICD-10-CM

## 2014-02-13 DIAGNOSIS — IMO0001 Reserved for inherently not codable concepts without codable children: Secondary | ICD-10-CM

## 2014-02-13 MED ORDER — INSULIN GLARGINE 100 UNIT/ML SOLOSTAR PEN: 23.0000 [IU] | PEN_INJECTOR | Freq: Every day | SUBCUTANEOUS | Status: DC

## 2014-02-13 NOTE — Progress Notes (Signed)
Patient ID: Debbie Chambers, female   DOB: 11/19/1929, 80 y.o.   MRN: [id number]  HPI: Debbie Chambers is a 80 y.o.-year-old female, returning for f/u for DM2, dx 1994, insulin-dependent, uncontrolled, with complications (CHF, proteinuria). She is here with her Chambers, which is her primary caregiver, Debbie entire history is given by her Chambers as Debbie patient is nonverbal. Last visit ~6 mo ago. She lives with her Chambers who was busy with her husband being in an MVA.  Last hemoglobin A1c was: - pending - on 02/12/2014 - 9.1% on 05/10/2013 - 6.1% on 11/21/2012 - 6.4% in 05/2012  She is on: - NovoLog - 10 units tid - Lantus 20 units in a.m. - restarted at last visit, as they though she needs to only be on NovoLog  Pt checks her sugars 3x day and they are better: - am:  96-152 ( highest 187) >> 87-190 >> 124-200 (63, 283) >> 157-275 >> 185-270 >> 138-170 - before lunch: 108-158 >> 135-180 >> n/c >> 218-230 >> 151-248 >> 121-159, 183, 204 - 2h after lunch: 106-261 >> n/c - Before dinner: 82-169 >>130-180 >> 136-286 (highest 307) >> 148-282 >> 99-250 >> 84-196, 247 No lows - it is unclear whether she has hypoglycemia awareness as she has dementia, and cannot communicate very well.   Pt has chronic kidney disease stage 3, latest BUN/creatinine was 41/1.39 on 05/10/2013 << 38/1.53 on 11/21/2012. She has labs drawn at home during home visits. Pt's last eye exam was in 11/2012 >> no DR. Denies numbness and tingling in her legs.  Pt has a history of hypertension, hyperlipidemia, dementia, history of A. Fib and proteinuria per documentation in 05/2005. Also, - Congestive heart failure - Vitamin D deficiency - Iron deficiency anemia - Poor circulation in extremity - hearing loss  ROS: Constitutional: + weight loss, no fatigue, no subjective hyperthermia/hypothermia Eyes: no blurry vision, no xerophthalmia ENT: no sore throat, no nodules palpated in throat, no dysphagia/odynophagia, no  hoarseness Cardiovascular: no CP/SOB/n palpitations/+ leg swelling Respiratory: no cough/SOB Gastrointestinal: no N/V/D/C Musculoskeletal: no muscle/joint aches Skin: no rashes Neurological: no tremors/numbness/tingling/dizziness  I reviewed pt's medications, allergies, PMH, social hx, family hx and no changes required, except as mentioned above.  PE: BP 172/82  Pulse 84  Temp(Src) 97.6 F (36.4 C) (Oral)  Resp 12  Wt 226 lb (102.513 kg)  SpO2 94% Wt Readings from Last 3 Encounters:  02/13/14 226 lb (102.513 kg)  07/23/13 234 lb (106.142 kg)  06/21/13 239 lb 12.8 oz (108.773 kg)   Constitutional: obese, in NAD, nonverbal Eyes: PERRLA, EOMI, no exophthalmos ENT: moist mucous membranes, no thyromegaly, no cervical lymphadenopathy Cardiovascular: RRR, No MRG, + bilateral pitting LE edema  Respiratory: CTA B Gastrointestinal: abdomen soft, NT, ND, BS+ Musculoskeletal: no deformities, strength intact in all 4 Skin: moist, warm, no rashes  ASSESSMENT: 1. DM2, insulin-dependent, uncontrolled, with complications - CHF - Proteinuria  05/15/2013: - HbA1c 9.1% - BMP normal, except glucose 318, BUN/Cr 41/1.39, GFR 36  11/21/2012: - HbA1c 6.1% - BMP normal, except CO2 34 (19-32), glucose 178, BUN/Cr 38/1.53, GFR 32  PLAN:  1. DM2  Patient with long-standing diabetes, with improved control after re-adding Lantus to her regimen - she is now given healthier snacks after our last discussion, but she sometimes has neighbors coming to Debbie house giving her sweets >> sugars higher then, but only occasional spikes.  - she lost 8 lbs in last 6 mo! - I also advised them to increase Debbie  Lantus a little, but no other changes needed: Patient Instructions  Please increase Lantus to 23 units in am. Continue NovoLog 10 units 3x a day before meals. Please return in 3 months with your sugar log.  - I advised Debbie Chambers to have pt's latest labs (from yesterday) sent to me - refilled her  Lantus - needs to schedule a new eye exam - continue to check sugars 3 times a day and bring Debbie log at next appointment - Return in about 3 months (around 05/15/2014).  02/18/2014 No HbA1c received - will definitely need to check it here next time.

## 2014-02-13 NOTE — Patient Instructions (Signed)
Please increase Lantus to 23 units in am. Continue NovoLog 10 units 3x a day before meals.  Please return in 3 months with your sugar log.

## 2014-03-11 ENCOUNTER — Other Ambulatory Visit: Payer: Self-pay | Admitting: *Deleted

## 2014-03-11 ENCOUNTER — Telehealth: Payer: Self-pay | Admitting: Internal Medicine

## 2014-03-11 MED ORDER — INSULIN ASPART 100 UNIT/ML FLEXPEN: PEN_INJECTOR | SUBCUTANEOUS | Status: DC

## 2014-03-11 NOTE — Telephone Encounter (Signed)
[place]ed [place], lvm advising her of the pt's updated insulin dosages (novolog and lantus) rx refill for novolog sent to pt's pharmacy. lantus refills already at pt's pharmacy. Advised her accordingly.

## 2014-03-11 NOTE — Telephone Encounter (Signed)
Patient is out of her insulin and physician home visit  They are needing updated inulin info to refill  Urgent!  Debbie Chambers [phone]  Thank you

## 2014-04-22 ENCOUNTER — Telehealth: Payer: Self-pay | Admitting: Internal Medicine

## 2014-04-22 NOTE — Telephone Encounter (Signed)
Pt needs rx for mini pen needles 4 times daily

## 2014-04-23 MED ORDER — INSULIN PEN NEEDLE 31G X 5 MM MISC: Status: AC

## 2014-04-23 NOTE — Telephone Encounter (Signed)
Rx sent to pt's pharmacy

## 2014-05-19 ENCOUNTER — Encounter: Payer: Self-pay | Admitting: Internal Medicine

## 2014-05-19 ENCOUNTER — Ambulatory Visit (INDEPENDENT_AMBULATORY_CARE_PROVIDER_SITE_OTHER): Payer: Medicare Other | Admitting: Internal Medicine

## 2014-05-19 VITALS — BP 104/60 | HR 92 | Temp 98.0°F | Resp 12 | Wt 234.0 lb

## 2014-05-19 DIAGNOSIS — E1129 Type 2 diabetes mellitus with other diabetic kidney complication: Secondary | ICD-10-CM

## 2014-05-19 DIAGNOSIS — N058 Unspecified nephritic syndrome with other morphologic changes: Secondary | ICD-10-CM

## 2014-05-19 NOTE — Patient Instructions (Signed)
Patient Instructions  Please increase Lantus to 26 units in am. Increae NovoLog to 13 units 3x a day before meals.  Please stop at the lab.  Please return in 1.5 months with your sugar log.

## 2014-05-19 NOTE — Progress Notes (Signed)
Patient ID: Debbie Chambers, female   DOB: 20-Dec-1929, 80 y.o.   MRN: [id number]  HPI: Debbie Chambers is a 80 y.o.-year-old female, returning for f/u for DM2, dx 1994, insulin-dependent, uncontrolled, with complications (CHF, proteinuria). She is here with her daughter, which is her primary caregiver, the entire history is given by her daughter as the patient is nonverbal. Last visit ~3 mo ago.   Last hemoglobin A1c was: - pending - on 02/12/2014 - 9.1% on 05/10/2013 - 6.1% on 11/21/2012 - 6.4% in 05/2012  She is on: - NovoLog - 10 units tid - Lantus 20 units in a.m.  Pt checks her sugars 3x day and they are high at lunchtime >> daughter believes it is as she has a snack before lunch: - am:  96-152 ( highest 187) >> 87-190 >> 124-200 (63, 283) >> 157-275 >> 185-270 >> 138-170 >> 141-182 - before lunch: 108-158 >> 135-180 >> n/c >> 218-230 >> 151-248 >> 121-159, 183, 204 >> 146-334 - 2h after lunch: 106-261 >> n/c - Before dinner: 82-169 >>130-180 >> 136-286 (highest 307) >> 148-282 >> 99-250 >> 84-196, 247 >> 109-288, most <180 No lows - it is unclear whether she has hypoglycemia awareness as she has dementia, and cannot communicate very well.   Pt has chronic kidney disease stage 3, latest BUN/creatinine was: 41/1.39 on 05/10/2013 << 38/1.53 on 11/21/2012. She has labs drawn at home during home visits. Pt's last eye exam was in 11/2012 >> no DR. Denies numbness and tingling in her legs.  Pt has a history of hypertension, hyperlipidemia, dementia, history of A. Fib and proteinuria per documentation in 05/2005. Also, - Congestive heart failure - Vitamin D deficiency - Iron deficiency anemia - Poor circulation in extremity - hearing loss  ROS: Constitutional: + weight gain, no fatigue, no subjective hyperthermia/hypothermia Eyes: no blurry vision, no xerophthalmia ENT: no sore throat, no nodules palpated in throat, no dysphagia/odynophagia, no hoarseness Cardiovascular: no CP/SOB/n  palpitations/+ leg swelling Respiratory: no cough/SOB Gastrointestinal: no N/V/D/C Musculoskeletal: no muscle/joint aches Skin: no rashes Neurological: no tremors/numbness/tingling/dizziness  I reviewed pt's medications, allergies, PMH, social hx, family hx, and changes were documented in the history of present illness. Otherwise, unchanged from my initial visit note.  She lives in an appt for disabled/handicapped people - aid comes from 9 am to 3 pm. Daughter comes in the evening and also has her on Sundays.  PE: BP 104/60 mmHg  Pulse 92  Temp(Src) 98 F (36.7 C) (Oral)  Resp 12  Wt 234 lb (106.142 kg)  SpO2 93% Wt Readings from Last 3 Encounters:  05/19/14 234 lb (106.142 kg)  02/13/14 226 lb (102.513 kg)  07/23/13 234 lb (106.142 kg)   Constitutional: obese, in NAD, nonverbal Eyes: PERRLA, EOMI, no exophthalmos ENT: moist mucous membranes, no thyromegaly, no cervical lymphadenopathy Cardiovascular: RRR, No MRG, + bilateral pitting LE edema  Respiratory: CTA B Gastrointestinal: abdomen soft, NT, ND, BS+ Musculoskeletal: no deformities, strength intact in all 4 Skin: moist, warm, no rashes  ASSESSMENT: 1. DM2, insulin-dependent, uncontrolled, with complications - CHF - Proteinuria  05/15/2013: - HbA1c 9.1% - BMP normal, except glucose 318, BUN/Cr 41/1.39, GFR 36  11/21/2012: - HbA1c 6.1% - BMP normal, except CO2 34 (19-32), glucose 178, BUN/Cr 38/1.53, GFR 32  PLAN:  1. DM2  Patient with long-standing diabetes, with improved control after re-adding Lantus to her regimen, but now with high sugars at lunchtime (only when daughter not home >> she suspects the aid is giving  her a snack before lunch >> will d/w her. However, all sugars still high >> will increase Lantus and NovoLog:  Patient Instructions  Please increase Lantus to 26 units in am. Increae NovoLog to 13 units 3x a day before meals.  Please stop at the lab.  Please return in 1.5 months with your sugar  log.  - will check HbA1c here today - needs to schedule a new eye exam - continue to check sugars 3 times a day and bring the log at next appointment >> I also advised them to check few times at bedtime - Return in about 6 weeks (around 06/30/2014).  Office Visit on 05/19/2014  Component Date Value Ref Range Status  . Hgb A1c MFr Bld 05/19/2014 7.7* 4.6 - 6.5 % Final   Glycemic Control Guidelines for People with Diabetes:Non Diabetic:  <6%Goal of Therapy: <7%Additional Action Suggested:  >8%    HbA1c higher, but the actual value is not terrible.

## 2014-05-20 LAB — HEMOGLOBIN A1C: HEMOGLOBIN A1C: 7.7 % — AB (ref 4.6–6.5)

## 2014-07-01 ENCOUNTER — Encounter: Payer: Self-pay | Admitting: Internal Medicine

## 2014-07-01 ENCOUNTER — Ambulatory Visit (INDEPENDENT_AMBULATORY_CARE_PROVIDER_SITE_OTHER): Payer: Medicare Other | Admitting: Internal Medicine

## 2014-07-01 VITALS — BP 152/86 | HR 99 | Temp 98.0°F | Resp 16 | Wt 234.6 lb

## 2014-07-01 DIAGNOSIS — E1165 Type 2 diabetes mellitus with hyperglycemia: Secondary | ICD-10-CM

## 2014-07-01 DIAGNOSIS — E1129 Type 2 diabetes mellitus with other diabetic kidney complication: Secondary | ICD-10-CM

## 2014-07-01 DIAGNOSIS — N058 Unspecified nephritic syndrome with other morphologic changes: Secondary | ICD-10-CM

## 2014-07-01 DIAGNOSIS — IMO0002 Reserved for concepts with insufficient information to code with codable children: Secondary | ICD-10-CM

## 2014-07-01 MED ORDER — INSULIN GLARGINE 100 UNIT/ML SOLOSTAR PEN: 30.0000 [IU] | PEN_INJECTOR | Freq: Every day | SUBCUTANEOUS | Status: AC

## 2014-07-01 NOTE — Patient Instructions (Signed)
Patient Instructions  Please increase Lantus to 30 units in am. Continue NovoLog to 13 units 3x a day before meals.  Please stop at the lab.  Please schedule a new eye exam.  Please return in 3 months with your sugar log.

## 2014-07-01 NOTE — Progress Notes (Signed)
Patient ID: Debbie Chambers, female   DOB: July 24, 1929, 81 y.o.   MRN: [id number]  HPI: Debbie Chambers is a 81 y.o.-year-old female, returning for f/u for DM2, dx 1994, insulin-dependent, uncontrolled, with complications (CHF, proteinuria). She is here with her granddaughter, the entire history is given by her granddaughter as the patient is nonverbal. Last visit 1.5 mo ago.   Last hemoglobin A1c was: Lab Results  Component Value Date   HGBA1C 7.7* 05/19/2014   HGBA1C 6.1* 11/13/2012   - 9.1% on 05/10/2013 - 6.1% on 11/21/2012 - 6.4% in 05/2012  She is on: - NovoLog - 10 >> 13 units tid - Lantus 20 >> 26 units in a.m.  Pt checks her sugars 3x day and they are high at lunchtime >> daughter believes it is as she has a snack before lunch: - am: 124-200 (63, 283) >> 157-275 >> 185-270 >> 138-170 >> 141-182  >> 124-155 - before lunch: 218-230 >> 151-248 >> 121-159, 183, 204 >> 146-334 >> 112-232, 310x1 - 2h after lunch: 106-261 >> n/c - Before dinner: 148-282 >> 99-250 >> 84-196, 247 >> 109-288, most <180 >> 117-171, 235x1 No lows - it is unclear whether she has hypoglycemia awareness as she has dementia, and cannot communicate very well.   Pt has chronic kidney: 41/1.39 on 05/10/2013 << 38/1.53 on 11/21/2012. She has labs drawn at home during home visits.  Pt's last eye exam was in 11/2012 >> no DR.  Denies numbness and tingling in her legs. Has knee pain and leg swelling >> painful.  Pt has a history of hypertension, hyperlipidemia, dementia, history of A. Fib and proteinuria per documentation in 05/2005. Also, - Congestive heart failure - Vitamin D deficiency - Iron deficiency anemia - Poor circulation in extremity - hearing loss  ROS: Constitutional: no weight gain, no fatigue, no subjective hyperthermia/hypothermia Eyes: no blurry vision, no xerophthalmia ENT: no sore throat, no nodules palpated in throat, no dysphagia/odynophagia, no hoarseness Cardiovascular: no CP/SOB/n  palpitations/+ leg swelling Respiratory: no cough/SOB Gastrointestinal: no N/V/D/C Musculoskeletal: no muscle/+ joint aches (knees) Skin: no rashes Neurological: no tremors/numbness/tingling/dizziness  I reviewed pt's medications, allergies, PMH, social hx, family hx, and changes were documented in the history of present illness. Otherwise, unchanged from my initial visit note.  She lives in an appt for disabled/handicapped people - aid comes from 9 am to 3 pm. Daughter comes in the evening and also has her on Sundays.  PE: BP 152/86 mmHg  Pulse 99  Temp(Src) 98 F (36.7 C)  Resp 16  Wt 234 lb 9.6 oz (106.414 kg)  SpO2 90% Wt Readings from Last 3 Encounters:  07/01/14 234 lb 9.6 oz (106.414 kg)  05/19/14 234 lb (106.142 kg)  02/13/14 226 lb (102.513 kg)   Constitutional: obese, in NAD, nonverbal Eyes: PERRLA, EOMI, no exophthalmos ENT: moist mucous membranes, no thyromegaly, no cervical lymphadenopathy Cardiovascular: RRR, No MRG, + bilateral pitting LE edema  Respiratory: CTA B Gastrointestinal: abdomen soft, NT, ND, BS+ Musculoskeletal: no deformities, strength intact in all 4 Skin: moist, warm, no rashes  ASSESSMENT: 1. DM2, insulin-dependent, uncontrolled, with complications - CHF - Proteinuria  05/15/2013: - HbA1c 9.1% - BMP normal, except glucose 318, BUN/Cr 41/1.39, GFR 36  11/21/2012: - HbA1c 6.1% - BMP normal, except CO2 34 (19-32), glucose 178, BUN/Cr 38/1.53, GFR 32  PLAN:  1. DM2  Patient with long-standing diabetes, with improved control after increasing Lantus; but sugars still higher sugars before lunch (the aid is giving her a  snack before lunch). However, all sugars still high >> will increase Lantus:  Patient Instructions  Please increase Lantus to 30 units in am. Continue NovoLog to 13 units 3x a day before meals.  Please stop at the lab.  Please schedule a new eye exam.  Please return in 3 months with your sugar log.  - will check HbA1c  at next visit - needs to schedule a new eye exam - continue to check sugars 3 times a day and bring the log at next appointment

## 2014-08-25 ENCOUNTER — Telehealth: Payer: Self-pay | Admitting: Internal Medicine

## 2014-08-25 MED ORDER — INSULIN ASPART 100 UNIT/ML FLEXPEN: PEN_INJECTOR | SUBCUTANEOUS | Status: AC

## 2014-08-25 NOTE — Telephone Encounter (Signed)
Pt needs novolog refill increased to 13 u 3 times daily

## 2014-08-25 NOTE — Telephone Encounter (Signed)
Change made to rx refill to 13 units 3xqd and sent to pt's pharmacy.

## 2014-10-03 ENCOUNTER — Ambulatory Visit: Payer: Medicare Other | Admitting: Internal Medicine

## 2017-10-21 DEATH — deceased
